# Patient Record
Sex: Female | Born: 2000 | Race: Black or African American | Hispanic: No | Marital: Single | Smoking: Never smoker
Health system: Southern US, Community
[De-identification: ages and names within clinical notes are randomized; demographics above are authoritative.]

## PROBLEM LIST (undated history)

## (undated) DIAGNOSIS — T7840XA Allergy, unspecified, initial encounter: Secondary | ICD-10-CM

## (undated) HISTORY — PX: NO PAST SURGERIES: SHX2092

## (undated) HISTORY — DX: Allergy, unspecified, initial encounter: T78.40XA

---
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
## 2022-01-01 ENCOUNTER — Other Ambulatory Visit: Payer: Self-pay

## 2022-01-01 ENCOUNTER — Encounter (HOSPITAL_BASED_OUTPATIENT_CLINIC_OR_DEPARTMENT_OTHER): Payer: Self-pay

## 2022-01-01 ENCOUNTER — Emergency Department (HOSPITAL_BASED_OUTPATIENT_CLINIC_OR_DEPARTMENT_OTHER)
Admission: EM | Admit: 2022-01-01 | Discharge: 2022-01-01 | Disposition: A | Discharge status: Home Health | Payer: Medicaid Other | Attending: Emergency Medicine | Admitting: Emergency Medicine

## 2022-01-01 DIAGNOSIS — N760 Acute vaginitis: Secondary | ICD-10-CM | POA: Diagnosis not present

## 2022-01-01 DIAGNOSIS — B9689 Other specified bacterial agents as the cause of diseases classified elsewhere: Secondary | ICD-10-CM | POA: Diagnosis not present

## 2022-01-01 DIAGNOSIS — H6123 Impacted cerumen, bilateral: Secondary | ICD-10-CM | POA: Diagnosis not present

## 2022-01-01 DIAGNOSIS — Z202 Contact with and (suspected) exposure to infections with a predominantly sexual mode of transmission: Secondary | ICD-10-CM

## 2022-01-01 LAB — WET PREP, GENITAL
Sperm: NONE SEEN
Trich, Wet Prep: NONE SEEN
WBC, Wet Prep HPF POC: <10 (ref <10)
Yeast Wet Prep HPF POC: NONE SEEN

## 2022-01-01 LAB — PREGNANCY, URINE: Preg Test, Ur: NEGATIVE

## 2022-01-01 MED ORDER — METRONIDAZOLE 500 MG PO TABS: 500.0000 mg | ORAL_TABLET | Freq: Two times a day (BID) | ORAL | 0 refills | Status: DC

## 2022-01-01 MED ORDER — CARBAMIDE PEROXIDE 6.5 % OT SOLN: 5.0000 [drp] | Freq: Two times a day (BID) | OTIC | 0 refills | Status: DC

## 2022-01-01 NOTE — ED Notes (Signed)
Pt states she would like an STD check. Denies any symptoms but states she thinks she was exposed to someone. Pt also states she would like her ear irrigated because she has been having some ringing and trouble hearing.  ?

## 2022-01-01 NOTE — Discharge Instructions (Addendum)
Your wet prep was positive for clue cells.  I will treat you with Flagyl for BV. ?We will contact you results of your lab work when it is available. ?Use the Debrox to help with the earwax. ?Return to the ER for severe abdominal or pelvic pain, abnormal bleeding, lightheadedness or chest pain ?

## 2022-01-01 NOTE — ED Notes (Signed)
Pt requesting to have ears flushed ?

## 2022-01-01 NOTE — ED Triage Notes (Addendum)
States was exposed to chlamydia. Wants to be swabbed for STD and get pregnancy test. Also states right ear has been "clogged". ?

## 2022-01-01 NOTE — ED Provider Notes (Signed)
?[facility] EM[facility]ENCY DEPARTMENT ?Provider Note ? ? ?CSN: [id number] ?Arrival date & time: 01/01/22  1015 ? ?  ? ?History ? ?Chief Complaint  ?Patient presents with  ? Exposure to STD  ? ? ?Colleen Cunningham is a 21 y.o. female presenting to the ED for multiple complaints.  Reports earwax buildup in both of her ears that she intermittently gets removed at PCPs office.  She has not done this in several months.  Reports muffled hearing because of this. ?Also concerned about STDs.  States that she was "indirectly" exposed to chlamydia and would like to be tested and treated.  Denies any vaginal discharge, abnormal bleeding, pelvic pain, sore throat or congestion. ? ? ?Exposure to STD ?Pertinent negatives include no chest pain, no abdominal pain and no shortness of breath.  ? ?  ? ?Home Medications ?Prior to Admission medications   ?Medication Sig Start Date End Date Taking? Authorizing Provider  ?carbamide peroxide (DEBROX) 6.5 % OTIC solution Place 5 drops into both ears 2 (two) times daily. 01/01/22  Yes Chevie Birkhead, PA-C  ?metroNIDAZOLE (FLAGYL) 500 MG tablet Take 1 tablet (500 mg total) by mouth 2 (two) times daily. 01/01/22  Yes Jerzy Roepke, PA-C  ?albuterol (VENTOLIN HFA) 108 (90 Base) MCG/ACT inhaler Inhale 2 puffs into the lungs every 6 (six) hours as needed for wheezing or shortness of breath. 10/29/20   Hall-Potvin, [place], PA-C  ?cetirizine (ZYRTEC) 10 MG tablet TAKE 1 TABLET BY MOUTH NIGHTLY AS NEEDED FOR ALLERGY 10/17/21   Rema Fendt, NP  ?mirtazapine (REMERON) 7.5 MG tablet Take 1 tablet (7.5 mg total) by mouth at bedtime. ?Patient not taking: Reported on 11/11/2021 07/23/21   Georges Mouse, NP  ?SUMAtriptan (IMITREX) 25 MG tablet Take 25 mg (1 tablet total) by mouth at the start of the headache. May repeat in 2 hours x 1 if headache persists. Max of 2 tablets/24 hours. ?Patient not taking: Reported on 10/11/2021 09/24/21   Rema Fendt, NP  ?   ? ?Allergies    ?Patient has no known  allergies.   ? ?Review of Systems   ?Review of Systems  ?Constitutional:  Negative for appetite change, chills and fever.  ?HENT:  Negative for ear pain, rhinorrhea, sneezing and sore throat.   ?Eyes:  Negative for photophobia and visual disturbance.  ?Respiratory:  Negative for cough, chest tightness, shortness of breath and wheezing.   ?Cardiovascular:  Negative for chest pain and palpitations.  ?Gastrointestinal:  Negative for abdominal pain, blood in stool, constipation, diarrhea, nausea and vomiting.  ?Genitourinary:  Negative for dysuria, hematuria and urgency.  ?Musculoskeletal:  Negative for myalgias.  ?Skin:  Negative for rash.  ?Neurological:  Negative for dizziness, weakness and light-headedness.  ? ?Physical Exam ?Updated Vital Signs ?BP (!) 97/49   Pulse 68   Temp 98.2 ?F (36.8 ?C) (Oral)   Resp 18   Ht 5\' 2"  (1.575 m)   Wt 54.4 kg   LMP 12/22/2021 (Exact Date)   SpO2 100%   BMI 21.95 kg/m?  ?Physical Exam ?Vitals and nursing note reviewed.  ?Constitutional:   ?   General: She is not in acute distress. ?   Appearance: She is well-developed.  ?HENT:  ?   Head: Normocephalic and atraumatic.  ?   Ears:  ?   Comments: Some cerumen noted in bilateral ears although not impacted. ?   Nose: Nose normal.  ?Eyes:  ?   General: No scleral icterus.    ?  Left eye: No discharge.  ?   Conjunctiva/sclera: Conjunctivae normal.  ?Cardiovascular:  ?   Rate and Rhythm: Normal rate and regular rhythm.  ?   Heart sounds: Normal heart sounds. No murmur heard. ?  No friction rub. No gallop.  ?Pulmonary:  ?   Effort: Pulmonary effort is normal. No respiratory distress.  ?   Breath sounds: Normal breath sounds.  ?Abdominal:  ?   General: Bowel sounds are normal. There is no distension.  ?   Palpations: Abdomen is soft.  ?   Tenderness: There is no abdominal tenderness. There is no guarding.  ?Musculoskeletal:     ?   General: Normal range of motion.  ?   Cervical back: Normal range of motion and neck supple.  ?Skin: ?    General: Skin is warm and dry.  ?   Findings: No rash.  ?Neurological:  ?   Mental Status: She is alert.  ?   Motor: No abnormal muscle tone.  ?   Coordination: Coordination normal.  ? ? ?ED Results / Procedures / Treatments   ?Labs ?(all labs ordered are listed, but only abnormal results are displayed) ?Labs Reviewed  ?WET PREP, GENITAL - Abnormal; Notable for the following components:  ?    Result Value  ? Clue Cells Wet Prep HPF POC PRESENT (*)   ? All other components within normal limits  ?PREGNANCY, URINE  ?GC/CHLAMYDIA PROBE AMP ([place]) NOT AT [facility]  ? ? ?EKG ?None ? ?Radiology ?No results found. ? ?Procedures ?Procedures  ? ? ?Medications Ordered in ED ?Medications - No data to display ? ?ED Course/ Medical Decision Making/ A&P ?Clinical Course as of 01/01/22 1302  ?Wed Jan 01, 2022  ?1235 Preg Test, Ur: NEGATIVE [HK]  ?1300 Clue Cells Wet Prep HPF POC(!): PRESENT [HK]  ?  ?Clinical Course User Index ?[HK] Idelle Leech, Melisha Eggleton, PA-C  ? ?                        ?Medical Decision Making ?Amount and/or Complexity of Data Reviewed ?Labs: ordered. Decision-making details documented in ED Course. ? ?Risk ?OTC drugs. ?Prescription drug management. ? ? ?21 year old female presenting to the ED for STD testing and ear irrigation.  There is no impacted cerumen seen on exam so we will treat with Debrox outpatient.  In regards to STD testing she self swab she is asymptomatic at this time.  She elects to wait for results of STD testing prior to treatment.  She is declining HIV and RPR testing.  Wet prep positive for clue cells so we will treat with Flagyl.  Return precautions given ? ? ? ?Patient is hemodynamically stable, in NAD, and able to ambulate in the ED. Evaluation does not show pathology that would require ongoing emergent intervention or inpatient treatment. I explained the diagnosis to the patient. Pain has been managed and has no complaints prior to discharge. Patient is comfortable with above plan and is stable  for discharge at this time. All questions were answered prior to disposition. Strict return precautions for returning to the ED were discussed. Encouraged follow up with PCP.  ? ?An After Visit Summary was printed and given to the patient. ? ? ?Portions of this note were generated with Scientist, clinical (histocompatibility and immunogenetics). Dictation errors may occur despite best attempts at proofreading. ? ? ? ? ? ? ? ? ?Final Clinical Impression(s) / ED Diagnoses ?Final diagnoses:  ?Possible exposure to STD  ?Excessive cerumen in  both ear canals  ?Bacterial vaginosis  ? ? ?Rx / DC Orders ?ED Discharge Orders   ? ?      Ordered  ?  carbamide peroxide (DEBROX) 6.5 % OTIC solution  2 times daily       ? 01/01/22 1111  ?  metroNIDAZOLE (FLAGYL) 500 MG tablet  2 times daily       ? 01/01/22 1300  ? ?  ?  ? ?  ? ? ?  Dietrich Pates?Griff Badley, PA-C ?01/01/22 1303 ? ?  ?Vanetta MuldersZackowski, Scott, MD ?01/06/22 [phone] ? ?

## 2022-01-01 NOTE — ED Notes (Signed)
Pt self swab for wet prep ?

## 2022-01-01 NOTE — ED Notes (Signed)
Pt will self swab for wet prep and gc/chlamydia per EDP Hina ?

## 2022-01-02 ENCOUNTER — Telehealth (HOSPITAL_COMMUNITY): Payer: Self-pay

## 2022-01-02 LAB — GC/CHLAMYDIA PROBE AMP (~~LOC~~) NOT AT ARMC
Chlamydia: POSITIVE — AB
Comment: NEGATIVE
Comment: NORMAL
Neisseria Gonorrhea: NEGATIVE

## 2022-01-02 MED ORDER — AZITHROMYCIN 500 MG PO TABS: 1000.0000 mg | ORAL_TABLET | Freq: Once | ORAL | 0 refills | Status: AC

## 2022-01-22 ENCOUNTER — Other Ambulatory Visit: Payer: Self-pay | Admitting: Family

## 2022-01-22 DIAGNOSIS — H6123 Impacted cerumen, bilateral: Secondary | ICD-10-CM

## 2022-01-27 ENCOUNTER — Encounter: Payer: Self-pay | Admitting: Physician Assistant

## 2022-01-27 ENCOUNTER — Other Ambulatory Visit (HOSPITAL_COMMUNITY)
Admission: RE | Admit: 2022-01-27 | Discharge: 2022-01-27 | Disposition: A | Discharge status: Home / Self Care | Payer: Medicaid Other | Source: Ambulatory Visit | Attending: Physician Assistant | Admitting: Physician Assistant

## 2022-01-27 ENCOUNTER — Ambulatory Visit (INDEPENDENT_AMBULATORY_CARE_PROVIDER_SITE_OTHER): Payer: Medicaid Other | Admitting: Physician Assistant

## 2022-01-27 VITALS — BP 123/74 | HR 84 | Temp 98.5°F | Resp 18 | Ht 62.0 in | Wt 116.0 lb

## 2022-01-27 DIAGNOSIS — Z3202 Encounter for pregnancy test, result negative: Secondary | ICD-10-CM

## 2022-01-27 DIAGNOSIS — Z8619 Personal history of other infectious and parasitic diseases: Secondary | ICD-10-CM | POA: Insufficient documentation

## 2022-01-27 DIAGNOSIS — B3731 Acute candidiasis of vulva and vagina: Secondary | ICD-10-CM

## 2022-01-27 DIAGNOSIS — Z7689 Persons encountering health services in other specified circumstances: Secondary | ICD-10-CM | POA: Diagnosis not present

## 2022-01-27 LAB — POCT URINE PREGNANCY: Preg Test, Ur: NEGATIVE

## 2022-01-27 NOTE — Progress Notes (Signed)
Patient has eaten today. ?Patient has not taken medication today. ?Patient denies pain at this time. ?Patient request testing due to exposure to STI ?

## 2022-01-27 NOTE — Progress Notes (Signed)
? ?Established Patient Office Visit ? ?Subjective:  ?Patient ID: Colleen Cunningham, female    DOB: 2001/05/10  Age: 21 y.o. MRN: [id number] ? ?CC:  ?Chief Complaint  ?Patient presents with  ? Exposure to STD  ? ? ?HPI ?Colleen Cunningham states that she was treated for chlamydia last month, states that she did complete her treatment.  States she would like to get a test of cure.  States she did not have any type of symptoms of chlamydia when she tested positive and is still not having any type of symptoms. ? ?Also requests pregnancy test.  Does endorse recent menses. ? ?Past Medical History:  ?Diagnosis Date  ? Allergy   ? Asthma   ? ? ?Past Surgical History:  ?Procedure Laterality Date  ? NO PAST SURGERIES    ? ? ?Family History  ?Problem Relation Age of Onset  ? Asthma Mother   ? Hypertension Father 101  ? Hypertension Paternal Aunt   ? Hypertension Paternal Uncle   ? Hypertension Paternal Grandmother   ? Cancer Paternal Grandmother   ? ? ?Social History  ? ?Socioeconomic History  ? Marital status: Single  ?  Spouse name: Not on file  ? Number of children: Not on file  ? Years of education: Not on file  ? Highest education level: Not on file  ?Occupational History  ? Not on file  ?Tobacco Use  ? Smoking status: Never  ? Smokeless tobacco: Never  ?Vaping Use  ? Vaping Use: Never used  ?Substance and Sexual Activity  ? Alcohol use: No  ? Drug use: Yes  ?  Types: Marijuana  ? Sexual activity: Yes  ?  Birth control/protection: None  ?Other Topics Concern  ? Not on file  ?Social History Narrative  ? [employer]  ? 10th grade  ? Lives home with dad.  ? ?Social Determinants of Health  ? ?Financial Resource Strain: Not on file  ?Food Insecurity: Not on file  ?Transportation Needs: Not on file  ?Physical Activity: Not on file  ?Stress: Not on file  ?Social Connections: Not on file  ?Intimate Partner Violence: Not on file  ? ? ?Outpatient Medications Prior to Visit  ?Medication Sig Dispense Refill  ? albuterol (VENTOLIN HFA) 108  (90 Base) MCG/ACT inhaler Inhale 2 puffs into the lungs every 6 (six) hours as needed for wheezing or shortness of breath. 8 g 2  ? carbamide peroxide (DEBROX) 6.5 % OTIC solution Place 5 drops into both ears 2 (two) times daily. 15 mL 0  ? cetirizine (ZYRTEC) 10 MG tablet TAKE 1 TABLET BY MOUTH NIGHTLY AS NEEDED FOR ALLERGY 90 tablet 1  ? metroNIDAZOLE (FLAGYL) 500 MG tablet Take 1 tablet (500 mg total) by mouth 2 (two) times daily. 14 tablet 0  ? mirtazapine (REMERON) 7.5 MG tablet Take 1 tablet (7.5 mg total) by mouth at bedtime. (Patient not taking: Reported on 11/11/2021) 90 tablet 0  ? SUMAtriptan (IMITREX) 25 MG tablet Take 25 mg (1 tablet total) by mouth at the start of the headache. May repeat in 2 hours x 1 if headache persists. Max of 2 tablets/24 hours. (Patient not taking: Reported on 10/11/2021) 30 tablet 0  ? ?No facility-administered medications prior to visit.  ? ? ?No Known Allergies ? ?ROS ?Review of Systems  ?Constitutional: Negative.   ?HENT: Negative.    ?Eyes: Negative.   ?Respiratory:  Negative for shortness of breath.   ?Cardiovascular:  Negative for chest pain.  ?Gastrointestinal:  Negative for  abdominal pain, nausea and vomiting.  ?Endocrine: Negative.   ?Genitourinary:  Negative for dysuria, genital sores and vaginal discharge.  ?Musculoskeletal: Negative.   ?Skin: Negative.   ?Allergic/Immunologic: Negative.   ?Neurological: Negative.   ?Hematological: Negative.   ?Psychiatric/Behavioral: Negative.    ? ?  ?Objective:  ?  ?Physical Exam ?Vitals and nursing note reviewed.  ?Constitutional:   ?   Appearance: Normal appearance.  ?HENT:  ?   Head: Normocephalic and atraumatic.  ?   Right Ear: External ear normal.  ?   Left Ear: External ear normal.  ?   Nose: Nose normal.  ?   Mouth/Throat:  ?   Mouth: Mucous membranes are moist.  ?   Pharynx: Oropharynx is clear.  ?Eyes:  ?   Extraocular Movements: Extraocular movements intact.  ?   Conjunctiva/sclera: Conjunctivae normal.  ?   Pupils:  Pupils are equal, round, and reactive to light.  ?Cardiovascular:  ?   Rate and Rhythm: Normal rate and regular rhythm.  ?   Pulses: Normal pulses.  ?   Heart sounds: Normal heart sounds.  ?Pulmonary:  ?   Effort: Pulmonary effort is normal.  ?   Breath sounds: Normal breath sounds.  ?Musculoskeletal:     ?   General: Normal range of motion.  ?   Cervical back: Normal range of motion and neck supple.  ?Skin: ?   General: Skin is warm and dry.  ?Neurological:  ?   General: No focal deficit present.  ?   Mental Status: She is alert and oriented to person, place, and time.  ?Psychiatric:     ?   Mood and Affect: Mood normal.     ?   Behavior: Behavior normal.     ?   Thought Content: Thought content normal.     ?   Judgment: Judgment normal.  ? ? ?BP 123/74 (BP Location: Left Arm, Patient Position: Sitting, Cuff Size: Normal)   Pulse 84   Temp 98.5 ?F (36.9 ?C) (Oral)   Resp 18   Ht _0  (1.575 m)   Wt 116 lb (52.6 kg)   LMP 01/21/2022   SpO2 100%   BMI 21.22 kg/m?  ?Wt Readings from Last 3 Encounters:  ?01/27/22 116 lb (52.6 kg) (30 %, Z= -0.52)*  ?01/01/22 120 lb (54.4 kg) (39 %, Z= -0.27)*  ?11/11/21 122 lb (55.3 kg) (44 %, Z= -0.15)*  ? ?* Growth percentiles are based on CDC (Girls, 2-20 Years) data.  ? ? ? ?Health Maintenance Due  ?Topic Date Due  ? HPV VACCINES (1 - 2-dose series) Never done  ? Hepatitis C Screening  Never done  ? ? ?   ?Topic Date Due  ? HPV VACCINES (1 - 2-dose series) Never done  ? ? ?No results found for: TSH ?No results found for: WBC, HGB, HCT, MCV, PLT ?No results found for: NA, K, CHLORIDE, CO2, GLUCOSE, BUN, CREATININE, BILITOT, ALKPHOS, AST, ALT, PROT, ALBUMIN, CALCIUM, ANIONGAP, EGFR, GFR ?No results found for: CHOL ?No results found for: HDL ?No results found for: [place] ?No results found for: TRIG ?No results found for: CHOLHDL ?No results found for: HGBA1C ? ?  ?Assessment & Plan:  ? ?Problem List Items Addressed This Visit   ? ?  ? Other  ? History of chlamydia infection  - Primary  ? ?Other Visit Diagnoses   ? ? Encounter for pregnancy test with result negative      ? Encounter for assessment of  STD exposure      ? Relevant Orders  ? Cervicovaginal ancillary only  ? POCT urine pregnancy (Completed)  ? ?  ? ? ?No orders of the defined types were placed in this encounter. ?1. History of chlamydia infection ?Patient education given on current guidelines for test of cure 3 months after treatment.  Patient does want to have testing completed today.  Declines testing for syphilis or HIV. ? ?2. Encounter for pregnancy test with result negative ? ? ?3. Encounter for assessment of STD exposure ? ?- Cervicovaginal ancillary only ?- POCT urine pregnancy ? ? ?I have reviewed the patient's medical history (PMH, PSH, Social History, Family History, Medications, and allergies) , and have been updated if relevant. I spent 20 minutes reviewing chart and  face to face time with patient. ? ? ? ? ?Follow-up: Return if symptoms worsen or fail to improve.  ? ? ?Deziyah Arvin S Mayers, PA-C ?

## 2022-01-27 NOTE — Patient Instructions (Signed)
Chlamydia, Female  Chlamydia is a sexually transmitted infection (STI). This infection spreads through sexual contact. Chlamydia can occur in different areas of the body, including: The urethra. This is the part of the body that drains urine from the bladder. The cervix. This is the lowest part of the uterus. The throat. The rectum. This condition is not difficult to treat. However, if left untreated, chlamydia can lead to more serious health problems, including pelvic inflammatory disease (PID). PID can increase your risk of being unable to have children. In pregnant women, untreated chlamydia can cause serious complications during pregnancy or health problems for the baby after delivery. What are the causes? This condition is caused by a bacteria called Chlamydia trachomatis. The bacteria are spread from an infected partner during sexual activity. Chlamydia can spread through contact with the genitals, mouth, or rectum. What increases the risk? The following factors may make you more likely to develop this condition: Not using a condom the right way or not using a condom every time you have sex. Having a new sex partner or having more than one sex partner. Being sexually active before age 25. What are the signs or symptoms? In some cases, there are no symptoms, especially early in the infection. If symptoms develop, they may include: Urinating often, or a burning feeling during urination. Redness, soreness, or swelling of the vagina or rectum. Discharge coming from the vagina or rectum. Pain in the abdomen. Pain during sex. Bleeding between menstrual periods or irregular periods. How is this diagnosed? This condition may be diagnosed with: Urine tests. Swab tests. Depending on your symptoms, your health care provider may use a cotton swab to collect a fluid sample from your vagina, rectum, nose, or throat to test for the bacteria. A pelvic exam. How is this treated? This condition is  treated with antibiotic medicines. Follow these instructions at home: Sexual activity Tell your sex partner or partners about your infection. These include any partners for oral, anal, or vaginal sex that you have had within 60 days of when your symptoms started. Sex partners should also be treated, even if they have no signs of the infection. Do not have sex until you and your sex partners have completed treatment and your health care provider says it is okay. If your health care provider prescribed you a single-dose medicine as treatment, wait at least 7 days after taking the medicine before having sex. General instructions Take over-the-counter and prescription medicines as told by your health care provider. Finish all antibiotic medicine even when you start to feel better. It is up to you to get your test results. Ask your health care provider, or the department that is doing the test, when your results will be ready. Keep all follow-up visits. This is important. You may need to be tested for infection again 3 months after treatment. How is this prevented? You can lower your risk of getting chlamydia by: Using latex or polyurethane condoms correctly every time you have sex. Not having multiple sex partners. Asking if your sex partner has been tested for STIs and had negative results. Getting regular health screenings to check for STIs. Contact a health care provider if: You develop new symptoms or your symptoms are getting worse. Your symptoms do not get better after treatment. You have a fever or chills. You have pain during sex. You have irregular menstrual periods, or you have bleeding between periods or after sex. You develop flu-like symptoms, such as night sweats, sore throat,   or muscle aches. You are unable to take your antibiotic medicine as prescribed. Summary Chlamydia is a sexually transmitted infection (STI) that is caused by bacteria. This infection spreads through sexual  contact. This condition is treated with antibiotic medicines. If left untreated, chlamydia can lead to more serious health problems, including pelvic inflammatory disease (PID). Your sex partners will also need to be treated. Do not have sex until both you and your partner have been treated. Take medicines as directed by your health care provider and keep all follow-up visits to ensure your infection has been completely treated. This information is not intended to replace advice given to you by your health care provider. Make sure you discuss any questions you have with your health care provider. Document Revised: 07/22/2021 Document Reviewed: 07/22/2021 Elsevier Patient Education  2023 Elsevier Inc.  

## 2022-01-28 LAB — CERVICOVAGINAL ANCILLARY ONLY
Bacterial Vaginitis (gardnerella): NEGATIVE
Candida Glabrata: NEGATIVE
Candida Vaginitis: POSITIVE — AB
Chlamydia: POSITIVE — AB
Comment: NEGATIVE
Comment: NEGATIVE
Comment: NEGATIVE
Comment: NEGATIVE
Comment: NEGATIVE
Comment: NORMAL
Neisseria Gonorrhea: NEGATIVE
Trichomonas: NEGATIVE

## 2022-01-28 MED ORDER — FLUCONAZOLE 150 MG PO TABS: 150.0000 mg | ORAL_TABLET | Freq: Once | ORAL | 0 refills | Status: AC

## 2022-01-28 NOTE — Addendum Note (Signed)
Addended by: Roney Jaffe on: 01/28/2022 12:37 PM ? ? Modules accepted: Orders ? ?

## 2022-01-31 ENCOUNTER — Telehealth: Payer: Self-pay | Admitting: *Deleted

## 2022-01-31 NOTE — Telephone Encounter (Signed)
Patient verified DOB ?Patient viewed results via mychart and will use condoms until she retest in 2 months. Patient will pick up diflucan for yeast and take today. ?

## 2022-01-31 NOTE — Telephone Encounter (Signed)
-----   Message from Roney Jaffe, [place] sent at 01/28/2022 12:37 PM EDT ----- ?Please help patient as expected her vaginal swab was positive for chlamydia, she just completed treatment of this and will do a test of cure in 2-1/2 months.  She did test positive for a yeast infection, Diflucan sent to her pharmacy. ?

## 2022-02-04 ENCOUNTER — Ambulatory Visit: Payer: Medicaid Other | Admitting: Family

## 2022-03-05 ENCOUNTER — Other Ambulatory Visit: Payer: Self-pay | Admitting: Pediatrics

## 2022-04-09 IMAGING — US US ABDOMEN COMPLETE
1 series · 15 of 25 positions shown · non-contrast
Comparison: None.

CLINICAL DATA: Abdominal pain.

EXAM:
ABDOMEN ULTRASOUND COMPLETE

[Series 1: us abdomen complete mc & wl · 15 of 87 slices shown]
[im 1/87]
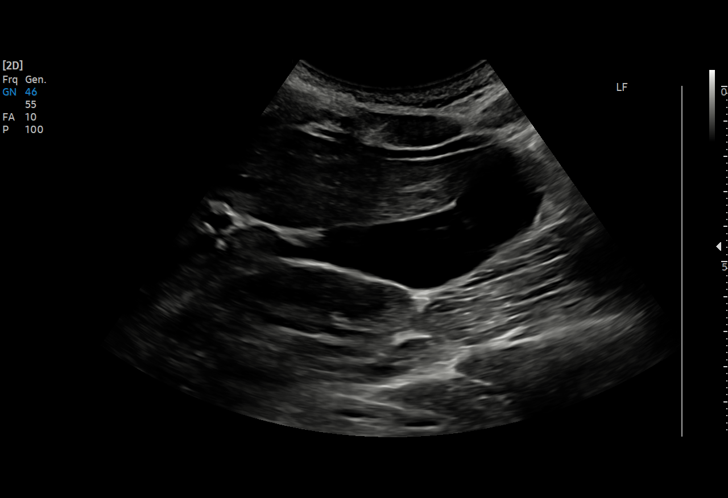
[im 8/87]
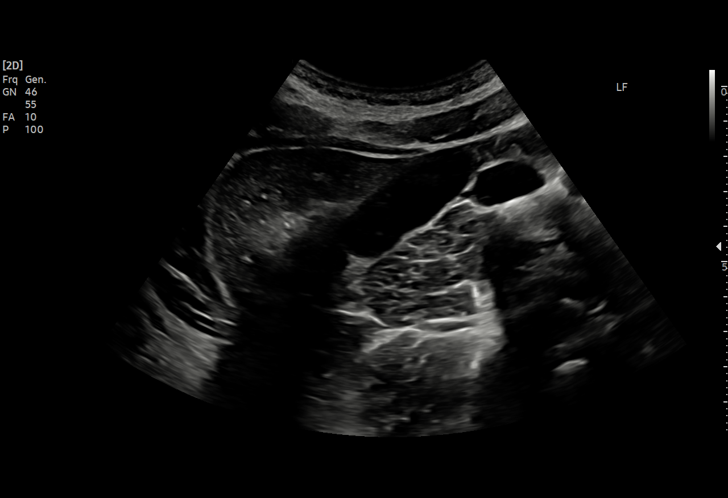
[im 15/87]
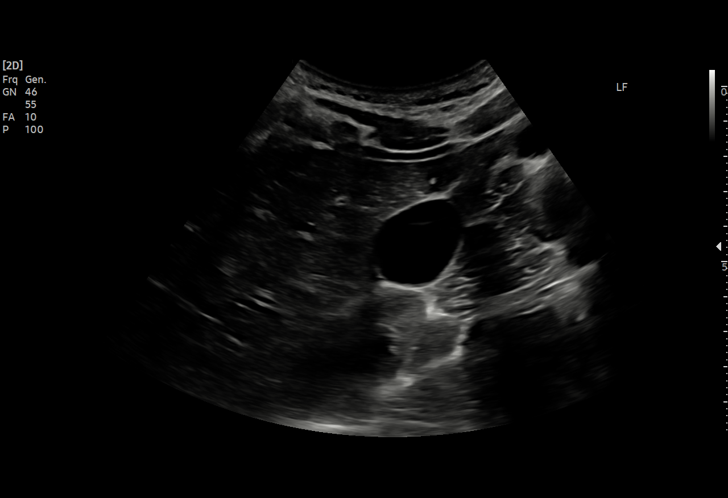
[im 18/87]
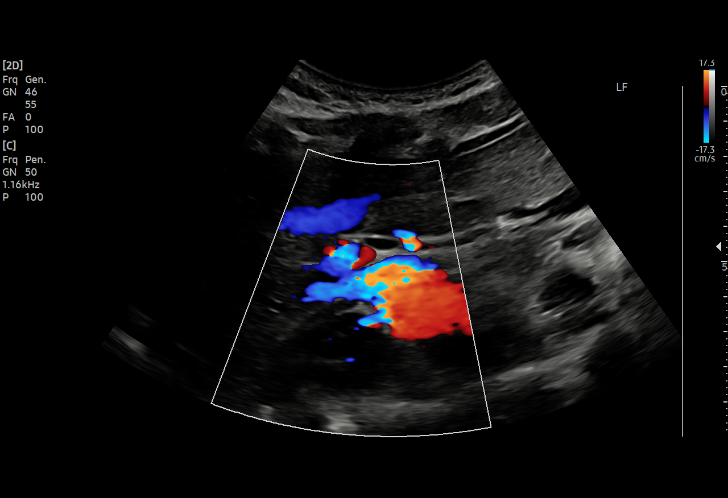
[im 26/87]
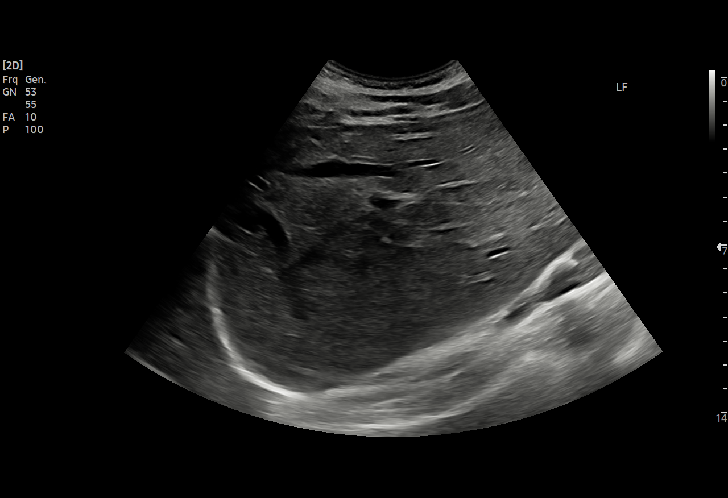
[im 33/87]
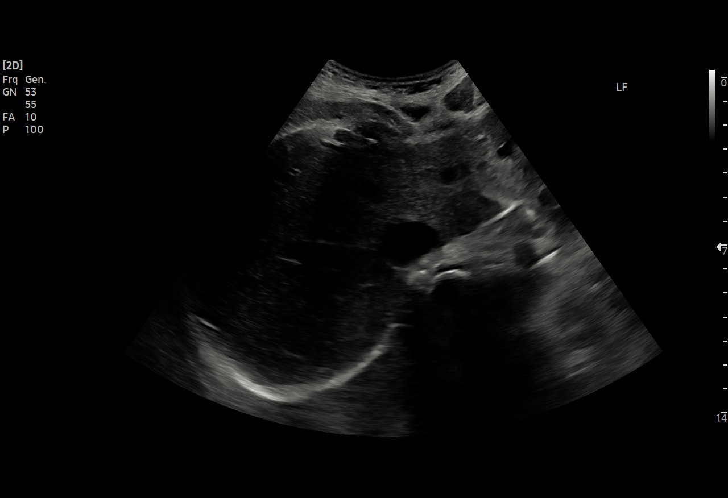
[im 36/87]
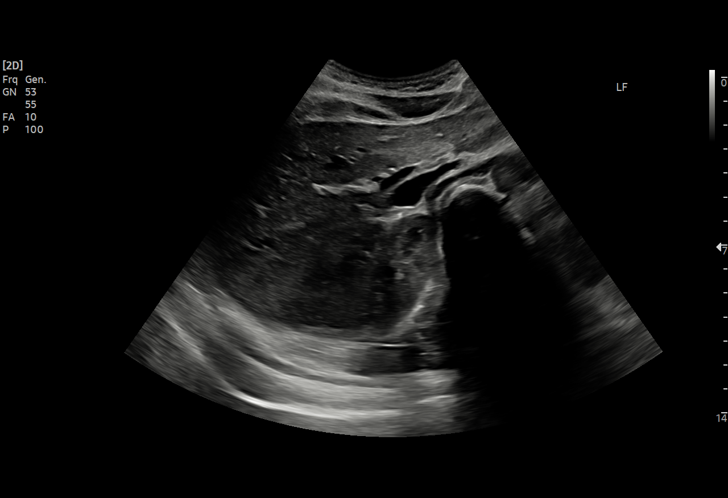
[im 44/87]
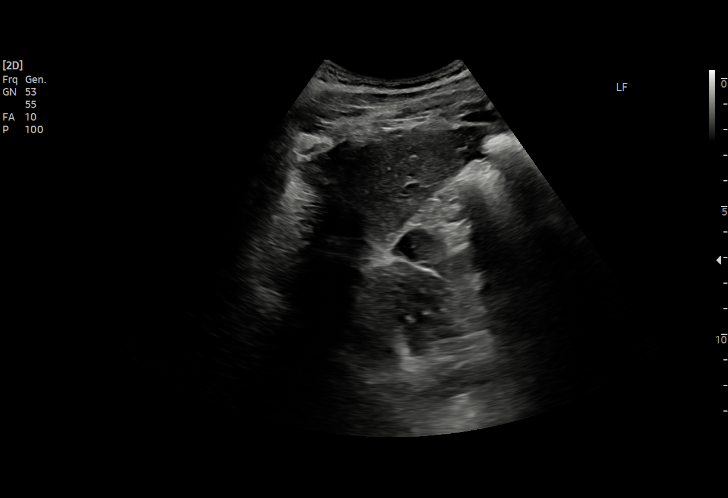
[im 51/87]
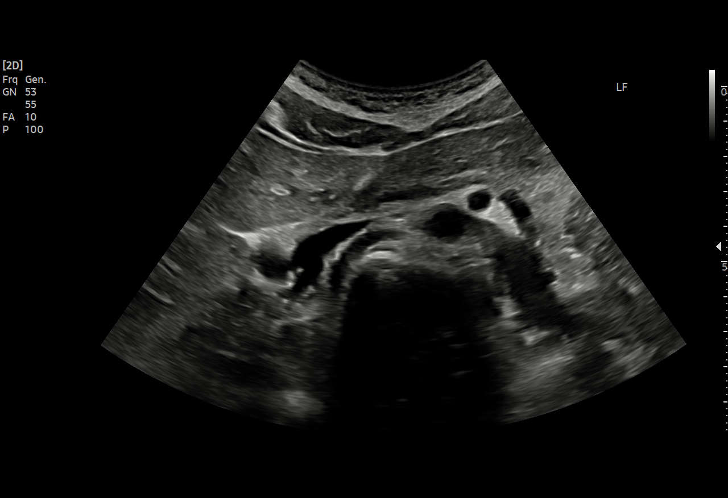
[im 54/87]
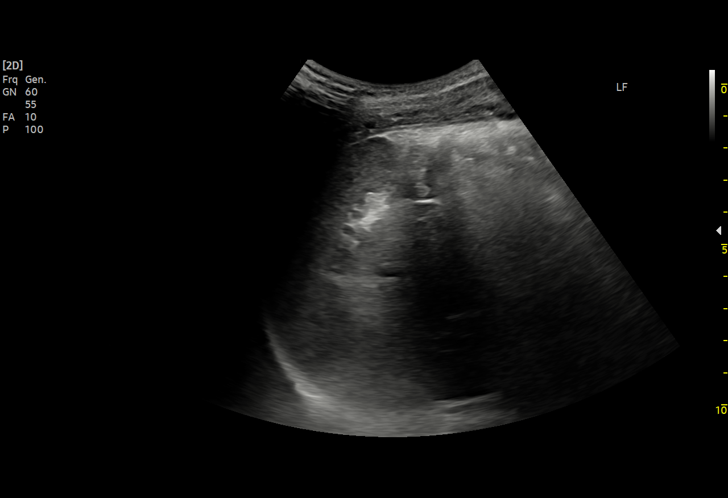
[im 61/87]
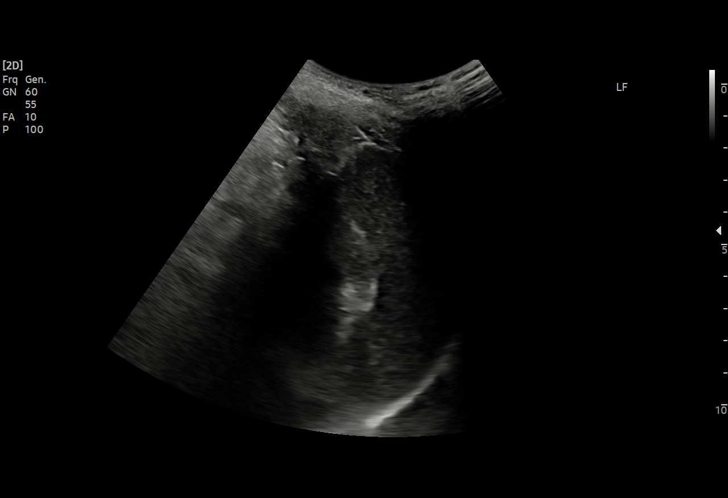
[im 69/87]
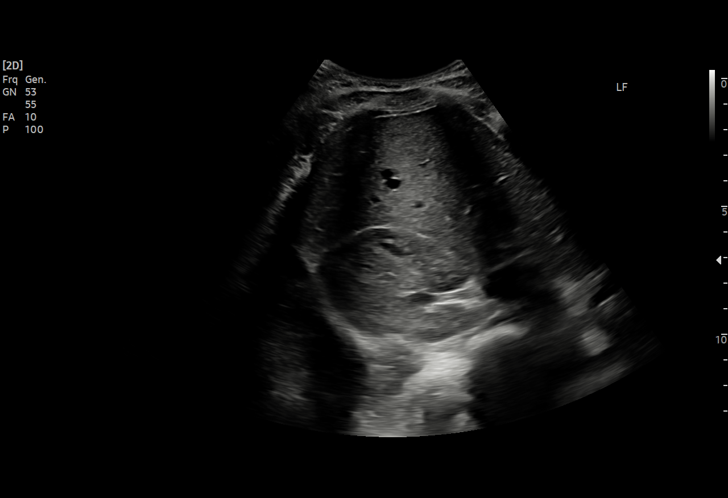
[im 72/87]
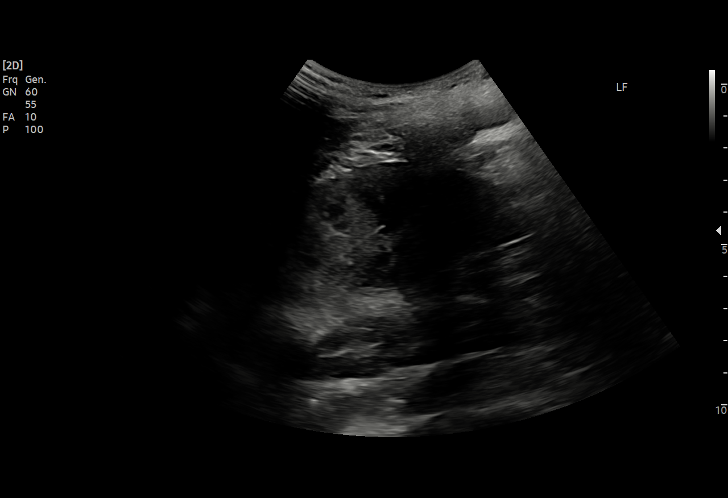
[im 79/87]
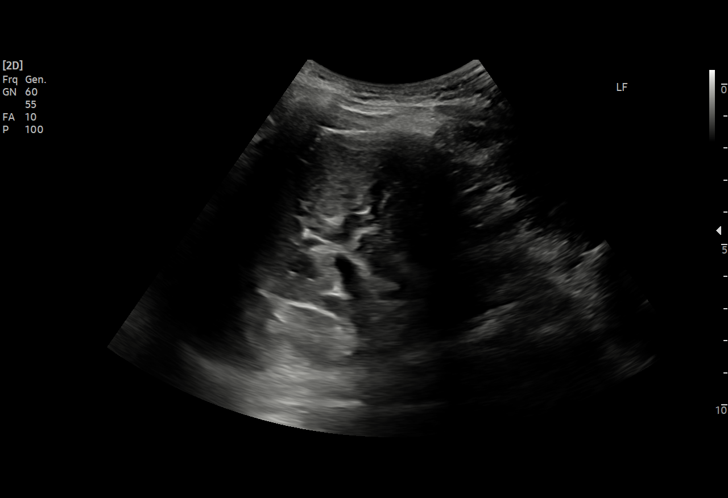
[im 87/87]
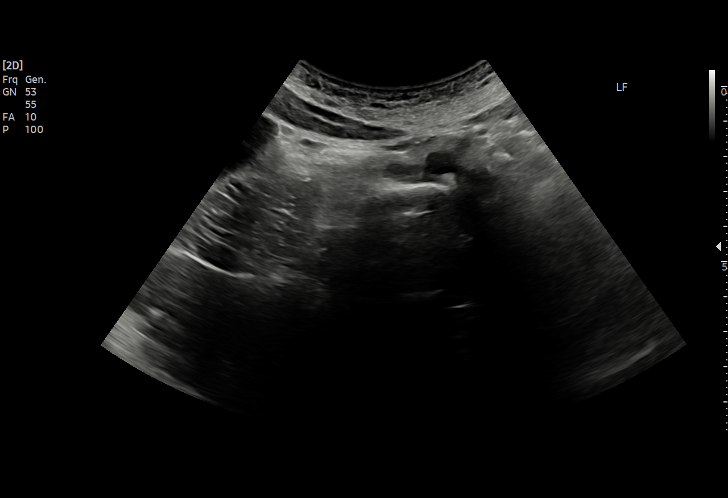

[15 of 25 positions shown; findings below may reference images not displayed]

FINDINGS: Gallbladder: No gallstones or wall thickening visualized (0.7 mm).
No sonographic Murphy sign noted by sonographer.

Common bile duct: Diameter: 4.2 mm

Liver: No focal lesion identified. Within normal limits in
parenchymal echogenicity. Portal vein is patent on color Doppler
imaging with normal direction of blood flow towards the liver.

IVC: No abnormality visualized.

Pancreas: Visualized portion unremarkable.

Spleen: Size (4.3 cm) and appearance within normal limits.

Right Kidney: Length: 10.5 cm. Echogenicity within normal limits. No
mass or hydronephrosis visualized.

Left Kidney: Length: 10.7 cm. Echogenicity within normal limits. No
mass or hydronephrosis visualized.

Abdominal aorta: No aneurysm visualized (1.4 cm in AP diameter).

Other findings: None.
IMPRESSION: Normal abdominal ultrasound.

## 2022-04-24 ENCOUNTER — Encounter: Payer: Self-pay | Admitting: Physician Assistant

## 2022-04-24 ENCOUNTER — Other Ambulatory Visit (HOSPITAL_COMMUNITY)
Admission: RE | Admit: 2022-04-24 | Discharge: 2022-04-24 | Disposition: A | Discharge status: Home / Self Care | Payer: Medicaid Other | Source: Ambulatory Visit | Attending: Physician Assistant | Admitting: Physician Assistant

## 2022-04-24 ENCOUNTER — Ambulatory Visit: Payer: Medicaid Other | Admitting: Physician Assistant

## 2022-04-24 VITALS — BP 119/82 | HR 79 | Resp 18 | Ht 62.0 in | Wt 116.0 lb

## 2022-04-24 DIAGNOSIS — J302 Other seasonal allergic rhinitis: Secondary | ICD-10-CM | POA: Diagnosis not present

## 2022-04-24 DIAGNOSIS — T148XXA Other injury of unspecified body region, initial encounter: Secondary | ICD-10-CM | POA: Diagnosis not present

## 2022-04-24 DIAGNOSIS — B9689 Other specified bacterial agents as the cause of diseases classified elsewhere: Secondary | ICD-10-CM

## 2022-04-24 DIAGNOSIS — F3289 Other specified depressive episodes: Secondary | ICD-10-CM

## 2022-04-24 DIAGNOSIS — Z8619 Personal history of other infectious and parasitic diseases: Secondary | ICD-10-CM | POA: Diagnosis not present

## 2022-04-24 DIAGNOSIS — F32A Depression, unspecified: Secondary | ICD-10-CM | POA: Insufficient documentation

## 2022-04-24 DIAGNOSIS — N760 Acute vaginitis: Secondary | ICD-10-CM

## 2022-04-24 MED ORDER — CETIRIZINE HCL 10 MG PO TABS: ORAL_TABLET | ORAL | 1 refills | Status: DC

## 2022-04-24 NOTE — Patient Instructions (Signed)
I encourage you to use the Neosporin on the affected wounds.  I strongly encourage you to reach out to the [facility] to see if you can schedule an appointment with the counselor, make sure that you keep your follow-up appointment with Amy on Tuesday.  Please let us know if there is anything else we can do for you, we will call you with your lab result when it is available.  Roney Jaffe, PA-C Physician Assistant [facility] [email or web address]   Abrasion An abrasion is a cut or a scrape on the surface of the skin. An abrasion does not go through all the layers of the skin. It is important to care for an abrasion properly to prevent infection. What are the causes? This condition is caused by rubbing your skin on something or falling on a surface, such as the ground. When your skin rubs on something, some layers of skin may rub off. What are the signs or symptoms? The main symptom of this condition is a cut or a scrape. The cut or scrape may be bleeding, or it may appear red or pink. If your abrasion was caused by a fall, there may be a bruise under your cut or scrape. How is this diagnosed? An abrasion is diagnosed with a physical exam. How is this treated? Treatment for this condition depends on how large and deep the abrasion is. In most cases: Your abrasion will be cleaned with water and mild soap. This is done to remove any dirt or debris (such as tiny bits of glass or rock) that may be stuck in your wound. An antibiotic ointment may be applied to your abrasion to help prevent infection. A bandage (dressing) may be placed on your abrasion to keep it clean. You may also need a tetanus shot. Follow these instructions at home: Medicines Take or apply over-the-counter and prescription medicines only as told by your health care provider. If you were prescribed an antibiotic medicine, use it as told by your health care provider. Do  not stop using the antibiotic even if you start to feel better. Wound care Clean your wound 1 or 2 times a day, or as told by your health care provider. To do this: Wash your hands for at least 20 seconds with mild soap and water. Do this before and after you clean your wound. Wash your wound with mild soap and water and then rinse off the soap. Pat your wound dry with a clean towel. Do not rub your wound. Keep your dressing clean and dry as told by your health care provider. There are many different ways to close and cover a wound. Follow instructions from your health care provider about caring for your wound and about changing and removing your dressing. You may have to change your dressing one or more times a day, or as directed by your health care provider. Check your wound every day for signs of infection. Check for: Redness, especially a red streak that spreads out from your wound. Swelling or increased pain. Warmth. Blood, fluid, pus, or a bad smell. Managing pain and swelling  If directed, put ice on the injured area. To do this: Put ice in a plastic bag. Place a towel between your skin and the bag. Leave the ice on for 20 minutes, 2-3 times a day. Remove the ice if your skin turns bright red. This is very important. If you cannot feel pain, heat, or cold, you have a greater risk of  damage to the area. If possible, raise (elevate) the injured area above the level of your heart while you are sitting or lying down. General instructions Do not take baths, swim, or use a hot tub until your health care provider approves. Ask your doctor about taking showers or sponge baths. Keep all follow-up visits. This is important. Contact a health care provider if: You received a tetanus shot, and you have swelling, severe pain, redness, or bleeding at your injection site. Your pain is not controlled with medicine. You have a fever. You have any of these signs of infection: Redness, swelling, or  more pain around your wound. Warmth coming from your wound. Blood, fluid, pus, or a bad smell coming from your wound. Get help right away if: You have a red streak spreading away from your wound. Summary An abrasion is a cut or a scrape on the surface of the skin. Care for your abrasion properly to prevent infection. Clean your wound with mild soap and water 1 or 2 times a day or as often as told. Follow instructions from your health care provider about taking medicines and changing your bandage (dressing). Contact your health care provider if you have a fever or if you have redness, swelling, or more pain around your wound. Contact your health care provider if you have warmth, blood, fluid, pus, or a bad smell coming from your wound. Get help right away if there is a red streak spreading away from your wound. This information is not intended to replace advice given to you by your health care provider. Make sure you discuss any questions you have with your health care provider. Document Revised: 12/29/2019 Document Reviewed: 12/29/2019 Elsevier Patient Education  [place] Depression, Adult Depression is a mental health condition that affects your thoughts, feelings, and actions. Being diagnosed with depression can bring you relief if you did not know why you have felt or behaved a certain way. It could also leave you feeling overwhelmed with uncertainty about your future. Preparing yourself to manage your symptoms can help you feel more positive about your future. How to manage lifestyle changes Managing stress  Stress is your body's reaction to life changes and events, both good and bad. Stress can add to your feelings of depression. Learning to manage your stress can help lessen your feelings of depression. Try some of the following approaches to reducing your stress (stress reduction techniques): Listen to music that you enjoy and that inspires you. Try using a  meditation app or take a meditation class. Develop a practice that helps you connect with your spiritual self. Walk in nature, pray, or go to a place of worship. Do some deep breathing. To do this, inhale slowly through your nose. Pause at the top of your inhale for a few seconds and then exhale slowly, letting your muscles relax. Practice yoga to help relax and work your muscles. Choose a stress reduction technique that suits your lifestyle and personality. These techniques take time and practice to develop. Set aside 5-15 minutes a day to do them. Therapists can offer training in these techniques. Other things you can do to manage stress include: Keeping a stress diary. Knowing your limits and saying no when you think something is too much. Paying attention to how you react to certain situations. You may not be able to control everything, but you can change your reaction. Adding humor to your life by watching funny films or TV shows. Making time for  activities that you enjoy and that relax you.  Medicines Medicines, such as antidepressants, are often a part of treatment for depression. Talk with your pharmacist or health care provider about all the medicines, supplements, and herbal products that you take, their possible side effects, and what medicines and other products are safe to take together. Make sure to report any side effects you may have to your health care provider. Relationships Your health care provider may suggest family therapy, couples therapy, or individual therapy as part of your treatment. How to recognize changes Everyone responds differently to treatment for depression. As you recover from depression, you may start to: Have more interest in doing activities. Feel less hopeless. Have more energy. Overeat less often, or have a better appetite. Have better mental focus. It is important to recognize if your depression is not getting better or is getting worse. The symptoms  you had in the beginning may return, such as: Tiredness (fatigue) or low energy. Eating too much or too little. Sleeping too much or too little. Feeling restless, agitated, or hopeless. Trouble focusing or making decisions. Unexplained physical complaints. Feeling irritable, angry, or aggressive. If you or your family members notice these symptoms coming back, let your health care provider know right away. Follow these instructions at home: Activity  Try to get some form of exercise each day, such as walking, biking, swimming, or lifting weights. Practice stress reduction techniques. Engage your mind by taking a class or doing some volunteer work. Lifestyle Get the right amount and quality of sleep. Cut down on using caffeine, tobacco, alcohol, and other potentially harmful substances. Eat a healthy diet that includes plenty of vegetables, fruits, whole grains, low-fat dairy products, and lean protein. Do not eat a lot of foods that are high in solid fats, added sugars, or salt (sodium). General instructions Take over-the-counter and prescription medicines only as told by your health care provider. Keep all follow-up visits as told by your health care provider. This is important. Where to find support Talking to others  Friends and family members can be sources of support and guidance. Talk to trusted friends or family members about your condition. Explain your symptoms to them, and let them know that you are working with a health care provider to treat your depression. Tell friends and family members how they also can be helpful. Finances Find appropriate mental health providers that fit with your financial situation. Talk with your health care provider about options to get reduced prices on your medicines. Where to find more information You can find support in your area from: Anxiety and Depression Association of America (ADAA): www.adaa.org Mental Health America:  www.mentalhealthamerica.net [employer] on Mental Illness: www.nami.org Contact a health care provider if: You stop taking your antidepressant medicines, and you have any of these symptoms: Nausea. Headache. Light-headedness. Chills and body aches. Not being able to sleep (insomnia). You or your friends and family think your depression is getting worse. Get help right away if: You have thoughts of hurting yourself or others. If you ever feel like you may hurt yourself or others, or have thoughts about taking your own life, get help right away. Go to your nearest emergency department or: Call your local emergency services (911 in the [place] Call a suicide crisis helpline, such as the National Suicide Prevention Lifeline at 8031090814 or 988 in the [place] This is open 24 hours a day in the [place] Text the Crisis Text Line at 916-006-8201 (in the [place] Summary If  you are diagnosed with depression, preparing yourself to manage your symptoms is a good way to feel positive about your future. Work with your health care provider on a management plan that includes stress reduction techniques, medicines (if applicable), therapy, and healthy lifestyle habits. Keep talking with your health care provider about how your treatment is working. If you have thoughts about taking your own life, call a suicide crisis helpline or text a crisis text line. This information is not intended to replace advice given to you by your health care provider. Make sure you discuss any questions you have with your health care provider. Document Revised: 04/24/2021 Document Reviewed: 08/10/2019 Elsevier Patient Education  [place].
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
## 2022-04-25 ENCOUNTER — Ambulatory Visit: Payer: Self-pay

## 2022-04-25 ENCOUNTER — Telehealth: Payer: Self-pay

## 2022-04-25 LAB — CERVICOVAGINAL ANCILLARY ONLY
Bacterial Vaginitis (gardnerella): POSITIVE — AB
Candida Glabrata: NEGATIVE
Candida Vaginitis: NEGATIVE
Chlamydia: NEGATIVE
Comment: NEGATIVE
Comment: NEGATIVE
Comment: NEGATIVE
Comment: NEGATIVE
Comment: NEGATIVE
Comment: NORMAL
Neisseria Gonorrhea: NEGATIVE
Trichomonas: NEGATIVE

## 2022-04-25 NOTE — Telephone Encounter (Signed)
Kayla RN spoke with pt.  Please see phone encounter.

## 2022-04-25 NOTE — Telephone Encounter (Signed)
Pt calling, seen results on Mychart for BV and was wanting to know if anything was going to be prescribed. I explained that Mychart results show quicker and the provider hadn't reviewed results yet but would send request for rx for positive BV and someone will fu with her on Monday. Pt verbalized understanding.

## 2022-04-25 NOTE — Progress Notes (Unsigned)
Patient ID: Colleen Cunningham, female    DOB: 03-02-2001  MRN: [id number]  CC: No chief complaint on file.   Subjective: Colleen Cunningham is a 21 y.o. female who presents for  Her concerns today include:  04/24/2022 [facility] with Maurene Capes, [place] Abrasion Patient given packets of triple antibiotic ointment, patient education given on supportive care.  Most abrasions healing well   Other depression still established with counselor? Patient education given on coping skills.  Patient strongly encouraged to follow-up with counselor.  Patient understands and agrees.  Red flags given for prompt reevaluation   Seasonal allergic rhinitis, unspecified trigger Refill per patient request - cetirizine (ZYRTEC) 10 MG tablet; TAKE 1 TABLET BY MOUTH NIGHTLY AS NEEDED FOR ALLERGY  Dispense: 90 tablet; Refill: 1   History of chlamydia infection Test of cure - Cervicovaginal ancillary only    Patient Active Problem List   Diagnosis Date Noted   Depression 04/24/2022   History of chlamydia infection 01/27/2022   Bacterial vaginitis 11/15/2020   Asthma 03/07/2011   Seasonal allergic rhinitis 03/07/2011     Current Outpatient Medications on File Prior to Visit  Medication Sig Dispense Refill   albuterol (VENTOLIN HFA) 108 (90 Base) MCG/ACT inhaler Inhale 2 puffs into the lungs every 6 (six) hours as needed for wheezing or shortness of breath. (Patient not taking: Reported on 04/24/2022) 8 g 2   carbamide peroxide (DEBROX) 6.5 % OTIC solution Place 5 drops into both ears 2 (two) times daily. (Patient not taking: Reported on 04/24/2022) 15 mL 0   cetirizine (ZYRTEC) 10 MG tablet TAKE 1 TABLET BY MOUTH NIGHTLY AS NEEDED FOR ALLERGY 90 tablet 1   metroNIDAZOLE (FLAGYL) 500 MG tablet Take 1 tablet (500 mg total) by mouth 2 (two) times daily. (Patient not taking: Reported on 04/24/2022) 14 tablet 0   mirtazapine (REMERON) 7.5 MG tablet Take 1 tablet (7.5 mg total) by mouth at bedtime. (Patient not  taking: Reported on 11/11/2021) 90 tablet 0   SUMAtriptan (IMITREX) 25 MG tablet Take 25 mg (1 tablet total) by mouth at the start of the headache. May repeat in 2 hours x 1 if headache persists. Max of 2 tablets/24 hours. (Patient not taking: Reported on 10/11/2021) 30 tablet 0   No current facility-administered medications on file prior to visit.    No Known Allergies  Social History   Socioeconomic History   Marital status: Single    Spouse name: Not on file   Number of children: Not on file   Years of education: Not on file   Highest education level: Not on file  Occupational History   Not on file  Tobacco Use   Smoking status: Never   Smokeless tobacco: Never  Vaping Use   Vaping Use: Never used  Substance and Sexual Activity   Alcohol use: No   Drug use: Yes    Types: Marijuana   Sexual activity: Yes    Birth control/protection: None  Other Topics Concern   Not on file  Social History Narrative   [name]   10th grade   Lives home with dad.   Social Determinants of Health   Financial Resource Strain: Not on file  Food Insecurity: Not on file  Transportation Needs: Not on file  Physical Activity: Not on file  Stress: Not on file  Social Connections: Not on file  Intimate Partner Violence: Not on file    Family History  Problem Relation Age of Onset  Asthma Mother    Hypertension Father 35   Hypertension Paternal Aunt    Hypertension Paternal Uncle    Hypertension Paternal Grandmother    Cancer Paternal Grandmother     Past Surgical History:  Procedure Laterality Date   NO PAST SURGERIES      ROS: Review of Systems Negative except as stated above  PHYSICAL EXAM: LMP 04/22/2022   Physical Exam  {female adult master:310786} {female adult master:310785}      No data to display         Lipid Panel  No results found for: "CHOL", "TRIG", "HDL", "CHOLHDL", "VLDL", "LDLCALC", "LDLDIRECT"  CBC No results found for: "WBC", "RBC",  "HGB", "HCT", "PLT", "MCV", "MCH", "MCHC", "RDW", "LYMPHSABS", "MONOABS", "EOSABS", "BASOSABS"  ASSESSMENT AND PLAN:  There are no diagnoses linked to this encounter.   Patient was given the opportunity to ask questions.  Patient verbalized understanding of the plan and was able to repeat key elements of the plan. Patient was given clear instructions to go to Emergency Department or return to medical center if symptoms don't improve, worsen, or new problems develop.The patient verbalized understanding.   No orders of the defined types were placed in this encounter.    Requested Prescriptions    No prescriptions requested or ordered in this encounter    No follow-ups on file.  Rema Fendt, NP

## 2022-04-26 ENCOUNTER — Telehealth: Payer: Self-pay | Admitting: Physician Assistant

## 2022-04-26 NOTE — Telephone Encounter (Signed)
Copied from CRM [id number]. Topic: General - Inquiry >> Apr 25, 2022  5:05 PM Teressa P wrote: Pt called Primary Care at [facility] saying some on called her about 1:45 from the office.  I looked in patients chart and there were no messages.  I called the office and got Lupita Leash on the phone who said no one there had called her.  She tried to get in touch with the nurse for provider Malissa Hippo.  Lupita Leash  got a nurse on the line who ask her if a CMA could talk to the patient but no one was able to talk to her and if so could only give her the results  and not prescribe anything.  She had a positive on some STD labs from yesterday.  I got off the phone with the office and am sending a CRM to the office and transferring the patient to Nurse Triage to see if they can get in touch with someone to prescribe medications the patient has no seen her results and is asking about medication.

## 2022-04-29 ENCOUNTER — Encounter: Payer: Medicaid Other | Admitting: Family

## 2022-04-29 MED ORDER — METRONIDAZOLE 500 MG PO TABS: 500.0000 mg | ORAL_TABLET | Freq: Two times a day (BID) | ORAL | 0 refills | Status: AC

## 2022-04-29 NOTE — Addendum Note (Signed)
Addended by: Roney Jaffe on: 04/29/2022 08:10 AM   Modules accepted: Orders

## 2022-07-17 ENCOUNTER — Other Ambulatory Visit (HOSPITAL_COMMUNITY)
Admission: RE | Admit: 2022-07-17 | Discharge: 2022-07-17 | Disposition: A | Discharge status: Home / Self Care | Payer: Medicaid Other | Source: Ambulatory Visit | Attending: Obstetrics and Gynecology | Admitting: Obstetrics and Gynecology

## 2022-07-17 ENCOUNTER — Ambulatory Visit (INDEPENDENT_AMBULATORY_CARE_PROVIDER_SITE_OTHER): Payer: Medicaid Other | Admitting: General Practice

## 2022-07-17 VITALS — BP 104/72 | HR 105 | Ht 62.0 in | Wt 121.7 lb

## 2022-07-17 DIAGNOSIS — N898 Other specified noninflammatory disorders of vagina: Secondary | ICD-10-CM

## 2022-07-17 LAB — POCT URINE PREGNANCY: Preg Test, Ur: NEGATIVE

## 2022-07-17 NOTE — Progress Notes (Signed)
SUBJECTIVE:  21 y.o. female complains of white and malodorous vaginal discharge for 1 week(s). Denies abnormal vaginal bleeding or significant pelvic pain or fever. No UTI symptoms. Denies history of known exposure to STD.  Pt requesting UPT today.  No LMP recorded.  OBJECTIVE:  She appears well, afebrile. Urine dipstick: not done.  ASSESSMENT:  Vaginal Discharge  Vaginal Odor   PLAN:  GC, chlamydia, trichomonas, BVAG, CVAG probe sent to lab. Treatment: To be determined once lab results are received ROV prn if symptoms persist or worsen.

## 2022-07-18 LAB — CERVICOVAGINAL ANCILLARY ONLY
Bacterial Vaginitis (gardnerella): POSITIVE — AB
Candida Glabrata: NEGATIVE
Candida Vaginitis: NEGATIVE
Chlamydia: NEGATIVE
Comment: NEGATIVE
Comment: NEGATIVE
Comment: NEGATIVE
Comment: NEGATIVE
Comment: NEGATIVE
Comment: NORMAL
Neisseria Gonorrhea: NEGATIVE
Trichomonas: NEGATIVE

## 2022-07-21 MED ORDER — METRONIDAZOLE 500 MG PO TABS: 500.0000 mg | ORAL_TABLET | Freq: Two times a day (BID) | ORAL | 0 refills | Status: DC

## 2022-07-21 NOTE — Addendum Note (Signed)
Addended by: Mora Bellman on: 07/21/2022 11:19 AM   Modules accepted: Orders

## 2022-08-05 NOTE — Progress Notes (Signed)
Erroneous encounter-disregard

## 2022-08-13 ENCOUNTER — Encounter: Payer: Medicaid Other | Admitting: Family

## 2022-08-13 DIAGNOSIS — F419 Anxiety disorder, unspecified: Secondary | ICD-10-CM

## 2022-09-08 ENCOUNTER — Other Ambulatory Visit (HOSPITAL_COMMUNITY)
Admission: RE | Admit: 2022-09-08 | Discharge: 2022-09-08 | Disposition: A | Discharge status: Home / Self Care | Payer: Medicaid Other | Source: Ambulatory Visit | Attending: Family Medicine | Admitting: Family Medicine

## 2022-09-08 ENCOUNTER — Encounter: Payer: Self-pay | Admitting: Family Medicine

## 2022-09-08 ENCOUNTER — Ambulatory Visit (INDEPENDENT_AMBULATORY_CARE_PROVIDER_SITE_OTHER): Payer: Medicaid Other | Admitting: Family Medicine

## 2022-09-08 VITALS — BP 96/68 | HR 90 | Temp 98.1°F | Resp 16 | Wt 121.0 lb

## 2022-09-08 DIAGNOSIS — F32A Depression, unspecified: Secondary | ICD-10-CM

## 2022-09-08 DIAGNOSIS — Z202 Contact with and (suspected) exposure to infections with a predominantly sexual mode of transmission: Secondary | ICD-10-CM

## 2022-09-08 DIAGNOSIS — N946 Dysmenorrhea, unspecified: Secondary | ICD-10-CM

## 2022-09-08 DIAGNOSIS — F419 Anxiety disorder, unspecified: Secondary | ICD-10-CM | POA: Diagnosis not present

## 2022-09-08 MED ORDER — IBUPROFEN 800 MG PO TABS: 800.0000 mg | ORAL_TABLET | Freq: Three times a day (TID) | ORAL | 3 refills | Status: AC | PRN

## 2022-09-08 NOTE — Progress Notes (Signed)
Established Patient Office Visit  Subjective    Patient ID: Colleen Cunningham, female    DOB: 05/11/2001  Age: 21 y.o. MRN: [id number]  CC:  Chief Complaint  Patient presents with   Exposure to STD    HPI Colleen Cunningham presents for STD check as she has a recent new sexual partner. She denies GU sx.    Outpatient Encounter Medications as of 09/08/2022  Medication Sig   ibuprofen (ADVIL) 800 MG tablet Take 1 tablet (800 mg total) by mouth every 8 (eight) hours as needed for cramping.   albuterol (VENTOLIN HFA) 108 (90 Base) MCG/ACT inhaler Inhale 2 puffs into the lungs every 6 (six) hours as needed for wheezing or shortness of breath. (Patient not taking: Reported on 04/24/2022)   carbamide peroxide (DEBROX) 6.5 % OTIC solution Place 5 drops into both ears 2 (two) times daily. (Patient not taking: Reported on 04/24/2022)   cetirizine (ZYRTEC) 10 MG tablet TAKE 1 TABLET BY MOUTH NIGHTLY AS NEEDED FOR ALLERGY   metroNIDAZOLE (FLAGYL) 500 MG tablet Take 1 tablet (500 mg total) by mouth 2 (two) times daily.   mirtazapine (REMERON) 7.5 MG tablet Take 1 tablet (7.5 mg total) by mouth at bedtime. (Patient not taking: Reported on 11/11/2021)   SUMAtriptan (IMITREX) 25 MG tablet Take 25 mg (1 tablet total) by mouth at the start of the headache. May repeat in 2 hours x 1 if headache persists. Max of 2 tablets/24 hours. (Patient not taking: Reported on 10/11/2021)   No facility-administered encounter medications on file as of 09/08/2022.    Past Medical History:  Diagnosis Date   Allergy    Asthma     Past Surgical History:  Procedure Laterality Date   NO PAST SURGERIES      Family History  Problem Relation Age of Onset   Asthma Mother    Hypertension Father 3   Hypertension Paternal Aunt    Hypertension Paternal Uncle    Hypertension Paternal Grandmother    Cancer Paternal Grandmother     Social History   Socioeconomic History   Marital status: Single    Spouse name: Not on file    Number of children: Not on file   Years of education: Not on file   Highest education level: Not on file  Occupational History   Not on file  Tobacco Use   Smoking status: Never   Smokeless tobacco: Never  Vaping Use   Vaping Use: Never used  Substance and Sexual Activity   Alcohol use: No   Drug use: Yes    Types: Marijuana   Sexual activity: Yes    Birth control/protection: None  Other Topics Concern   Not on file  Social History Narrative   [name]   10th grade   Lives home with dad.   Social Determinants of Health   Financial Resource Strain: Not on file  Food Insecurity: Not on file  Transportation Needs: Not on file  Physical Activity: Not on file  Stress: Not on file  Social Connections: Not on file  Intimate Partner Violence: Not on file    Review of Systems  All other systems reviewed and are negative.       Objective    BP 96/68   Pulse 90   Temp 98.1 F (36.7 C) (Oral)   Resp 16   Wt 121 lb (54.9 kg)   SpO2 98%   BMI 22.13 kg/m   Physical Exam Vitals and nursing note reviewed.  Constitutional:      General: She is not in acute distress. Cardiovascular:     Rate and Rhythm: Normal rate and regular rhythm.  Pulmonary:     Effort: Pulmonary effort is normal.     Breath sounds: Normal breath sounds.  Abdominal:     Palpations: Abdomen is soft.     Tenderness: There is no abdominal tenderness.  Neurological:     General: No focal deficit present.     Mental Status: She is alert and oriented to person, place, and time.         Assessment & Plan:   1. Encounter for assessment of STD exposure  - Cervicovaginal ancillary only  2. Anxiety and depression Patient has been on meds previously but stopped them 2/2 sedation. Patient agrees to follow up with SW for counseling and to consider if she is willing to start zoloft or other med.  3. Dysmenorrhea Ibuprofen 800 mg prescribed    Return in about 3 months (around  12/09/2022).   Tommie Raymond, MD

## 2022-09-09 ENCOUNTER — Telehealth: Payer: Self-pay | Admitting: Licensed Clinical Social Worker

## 2022-09-09 LAB — CERVICOVAGINAL ANCILLARY ONLY
Bacterial Vaginitis (gardnerella): POSITIVE — AB
Candida Glabrata: NEGATIVE
Candida Vaginitis: NEGATIVE
Chlamydia: NEGATIVE
Comment: NEGATIVE
Comment: NEGATIVE
Comment: NEGATIVE
Comment: NEGATIVE
Comment: NEGATIVE
Comment: NORMAL
Neisseria Gonorrhea: NEGATIVE
Trichomonas: NEGATIVE

## 2022-09-09 NOTE — Telephone Encounter (Signed)
Please follow up and evaluate clients needs from notes and client. Provide resources as needed.

## 2022-09-11 ENCOUNTER — Other Ambulatory Visit: Payer: Self-pay | Admitting: Family Medicine

## 2022-09-11 MED ORDER — METRONIDAZOLE 500 MG PO TABS: 500.0000 mg | ORAL_TABLET | Freq: Two times a day (BID) | ORAL | 0 refills | Status: DC

## 2022-10-21 ENCOUNTER — Ambulatory Visit
Admission: EM | Admit: 2022-10-21 | Discharge: 2022-10-21 | Disposition: A | Discharge status: Home / Self Care | Payer: Medicaid Other | Attending: Physician Assistant | Admitting: Physician Assistant

## 2022-10-21 DIAGNOSIS — Z1152 Encounter for screening for COVID-19: Secondary | ICD-10-CM | POA: Diagnosis not present

## 2022-10-21 DIAGNOSIS — R509 Fever, unspecified: Secondary | ICD-10-CM | POA: Diagnosis not present

## 2022-10-21 DIAGNOSIS — J111 Influenza due to unidentified influenza virus with other respiratory manifestations: Secondary | ICD-10-CM

## 2022-10-21 DIAGNOSIS — R051 Acute cough: Secondary | ICD-10-CM | POA: Insufficient documentation

## 2022-10-21 MED ORDER — ALBUTEROL SULFATE HFA 108 (90 BASE) MCG/ACT IN AERS: 2.0000 | INHALATION_SPRAY | Freq: Four times a day (QID) | RESPIRATORY_TRACT | 2 refills | Status: AC | PRN

## 2022-10-21 MED ORDER — OSELTAMIVIR PHOSPHATE 75 MG PO CAPS: 75.0000 mg | ORAL_CAPSULE | Freq: Two times a day (BID) | ORAL | 0 refills | Status: DC

## 2022-10-21 NOTE — ED Triage Notes (Signed)
Pt said x 3 day has been having body aches, coughing, nasal drainage.

## 2022-10-21 NOTE — ED Provider Notes (Signed)
[name]    CSN: [id number] Arrival date & time: 10/21/22  1105      History   Chief Complaint Chief Complaint  Patient presents with   Cough   Nasal Congestion   Generalized Body Aches    HPI Colleen Cunningham is a 22 y.o. female.   22 year old female presents with cough, congestion and fever.  Patient indicates for the past 3 days she has been having upper respiratory symptoms of cough, congestion, rhinitis which is mainly been clear to yellow.  She also indicates she has been having chest congestion with intermittent cough, production has been yellow.  She relates that she has been having fever 100-101, chills, body aches and pain, muscle soreness, and fatigue.  Patient indicates that she has asthma and has an albuterol inhaler but she needs a refill.  She relates she has not had any wheezing or shortness of breath over the past couple days.  She indicates she just does not feel good.  Patient indicates she has not been around any family or friends that been sick however she relates a lot of her coworkers have been sick and have been calling out.  Patient is without nausea or vomiting.   Cough Associated symptoms: chills, fever and rhinorrhea     Past Medical History:  Diagnosis Date   Allergy    Asthma     Patient Active Problem List   Diagnosis Date Noted   Depression 04/24/2022   History of chlamydia infection 01/27/2022   Bacterial vaginitis 11/15/2020   Asthma 03/07/2011   Seasonal allergic rhinitis 03/07/2011    Past Surgical History:  Procedure Laterality Date   NO PAST SURGERIES      OB History   No obstetric history on file.      Home Medications    Prior to Admission medications   Medication Sig Start Date End Date Taking? Authorizing Provider  oseltamivir (TAMIFLU) 75 MG capsule Take 1 capsule (75 mg total) by mouth every 12 (twelve) hours. 10/21/22  Yes Ellsworth Lennox, PA-C  albuterol (VENTOLIN HFA) 108 (90 Base) MCG/ACT inhaler Inhale  2 puffs into the lungs every 6 (six) hours as needed for wheezing or shortness of breath. 10/21/22   Ellsworth Lennox, PA-C  carbamide peroxide (DEBROX) 6.5 % OTIC solution Place 5 drops into both ears 2 (two) times daily. Patient not taking: Reported on 04/24/2022 01/01/22   Dietrich Pates, PA-C  cetirizine (ZYRTEC) 10 MG tablet TAKE 1 TABLET BY MOUTH NIGHTLY AS NEEDED FOR ALLERGY 04/24/22   Mayers, Cari S, PA-C  ibuprofen (ADVIL) 800 MG tablet Take 1 tablet (800 mg total) by mouth every 8 (eight) hours as needed for cramping. 09/08/22   Georganna Skeans, MD  metroNIDAZOLE (FLAGYL) 500 MG tablet Take 1 tablet (500 mg total) by mouth 2 (two) times daily. 09/11/22   Georganna Skeans, MD  mirtazapine (REMERON) 7.5 MG tablet Take 1 tablet (7.5 mg total) by mouth at bedtime. Patient not taking: Reported on 11/11/2021 07/23/21   Georges Mouse, NP  SUMAtriptan (IMITREX) 25 MG tablet Take 25 mg (1 tablet total) by mouth at the start of the headache. May repeat in 2 hours x 1 if headache persists. Max of 2 tablets/24 hours. Patient not taking: Reported on 10/11/2021 09/24/21   Rema Fendt, NP    Family History Family History  Problem Relation Age of Onset   Asthma Mother    Hypertension Father 38   Hypertension Paternal Aunt    Hypertension Paternal  Uncle    Hypertension Paternal Grandmother    Cancer Paternal Grandmother     Social History Social History   Tobacco Use   Smoking status: Never   Smokeless tobacco: Never  Vaping Use   Vaping Use: Never used  Substance Use Topics   Alcohol use: No   Drug use: Yes    Types: Marijuana     Allergies   Patient has no known allergies.   Review of Systems Review of Systems  Constitutional:  Positive for chills, fatigue and fever.  HENT:  Positive for postnasal drip and rhinorrhea.   Respiratory:  Positive for cough.      Physical Exam Triage Vital Signs ED Triage Vitals  Enc Vitals Group     BP 10/21/22 1124 115/81     Pulse Rate  10/21/22 1124 (!) 134     Resp 10/21/22 1124 16     Temp 10/21/22 1124 100 F (37.8 C)     Temp Source 10/21/22 1124 Oral     SpO2 10/21/22 1124 95 %     Weight --      Height --      Head Circumference --      Peak Flow --      Pain Score 10/21/22 1122 4     Pain Loc --      Pain Edu? --      Excl. in [place]? --    No data found.  Updated Vital Signs BP 115/81 (BP Location: Left Arm)   Pulse (!) 134   Temp 100 F (37.8 C) (Oral)   Resp 16   LMP 10/20/2022 (Exact Date)   SpO2 95%   Visual Acuity Right Eye Distance:   Left Eye Distance:   Bilateral Distance:    Right Eye Near:   Left Eye Near:    Bilateral Near:     Physical Exam Constitutional:      Appearance: Normal appearance.  HENT:     Right Ear: Tympanic membrane and ear canal normal.     Left Ear: Tympanic membrane and ear canal normal.     Mouth/Throat:     Mouth: Mucous membranes are moist.     Pharynx: Oropharynx is clear. No posterior oropharyngeal erythema.  Cardiovascular:     Rate and Rhythm: Normal rate and regular rhythm.     Heart sounds: Normal heart sounds.  Pulmonary:     Effort: Pulmonary effort is normal.     Breath sounds: Normal breath sounds and air entry. No wheezing, rhonchi or rales.  Lymphadenopathy:     Cervical: No cervical adenopathy.  Neurological:     Mental Status: She is alert.      UC Treatments / Results  Labs (all labs ordered are listed, but only abnormal results are displayed) Labs Reviewed  SARS CORONAVIRUS 2 (TAT 6-24 HRS)    EKG   Radiology No results found.  Procedures Procedures (including critical care time)  Medications Ordered in UC Medications - No data to display  Initial Impression / Assessment and Plan / UC Course  I have reviewed the triage vital signs and the nursing notes.  Pertinent labs & imaging results that were available during my care of the patient were reviewed by me and considered in my medical decision making (see chart for  details).    Plan: 1.  Screening for COVID-19 will be treated with the following: A.  Treatment may be considered depending on results of COVID-19 testing. 2.  The  flu will be treated with the following: A.  Tamiflu 75 mg every 12 hours to treat the flu. 3.  The acute cough will be treated with the following: A.  Advised patient to use an OTC cough preparations to control the cough. B.  Albuterol inhaler, 2 puffs every 6 hours on a regular basis as needed for cough, wheezing, shortness of breath. 4.  The fever be treated with the following: A.  Advised patient to take ibuprofen or Tylenol to help control fever and body aches. 5.  Advised to follow-up with PCP or return to urgent care if symptoms fail to improve. Final Clinical Impressions(s) / UC Diagnoses   Final diagnoses:  Fever, unspecified  Acute cough  Flu  Encounter for screening for COVID-19     Discharge Instructions      Advised take Tamiflu 75 mg every 12 hours to treat the flu.  Taking this medicine should help you to feel better within the next 48 hours. Advised to use the albuterol inhaler as needed for wheezing and shortness of breath, 2 puffs every 6 hours as needed. Advised take ibuprofen or Tylenol as needed for fever, aches and pains and body discomfort. Advised to continue take OTC cough preparations to control the cough and congestion. Advised to rest for the next couple days, follow-up with PCP or return to urgent care if symptoms fail to improve.    ED Prescriptions     Medication Sig Dispense Auth. Provider   albuterol (VENTOLIN HFA) 108 (90 Base) MCG/ACT inhaler Inhale 2 puffs into the lungs every 6 (six) hours as needed for wheezing or shortness of breath. 8 g Ellsworth Lennox, PA-C   oseltamivir (TAMIFLU) 75 MG capsule Take 1 capsule (75 mg total) by mouth every 12 (twelve) hours. 10 capsule Ellsworth Lennox, PA-C      PDMP not reviewed this encounter.   Ellsworth Lennox, PA-C 10/21/22 1158

## 2022-10-21 NOTE — Discharge Instructions (Addendum)
Advised take Tamiflu 75 mg every 12 hours to treat the flu.  Taking this medicine should help you to feel better within the next 48 hours. Advised to use the albuterol inhaler as needed for wheezing and shortness of breath, 2 puffs every 6 hours as needed. Advised take ibuprofen or Tylenol as needed for fever, aches and pains and body discomfort. Advised to continue take OTC cough preparations to control the cough and congestion. Advised to rest for the next couple days, follow-up with PCP or return to urgent care if symptoms fail to improve.

## 2022-10-22 LAB — SARS CORONAVIRUS 2 (TAT 6-24 HRS): SARS Coronavirus 2: NEGATIVE

## 2022-11-13 NOTE — Telephone Encounter (Signed)
I called patient today to introduce [facility] and to assess patients' mental health needs. Patient did not answer the phone. I was not able to leave a brief message as they voicemail box is full. Will call again to confirm needs for counseling or outside resources for mental health.  Patient was referred by PCP for anxiety and depression

## 2022-12-15 ENCOUNTER — Ambulatory Visit: Payer: Medicaid Other

## 2022-12-15 ENCOUNTER — Telehealth: Payer: Self-pay

## 2022-12-15 ENCOUNTER — Other Ambulatory Visit (HOSPITAL_COMMUNITY)
Admission: RE | Admit: 2022-12-15 | Discharge: 2022-12-15 | Disposition: A | Discharge status: Home / Self Care | Payer: Medicaid Other | Source: Ambulatory Visit | Attending: Family | Admitting: Family

## 2022-12-15 DIAGNOSIS — Z202 Contact with and (suspected) exposure to infections with a predominantly sexual mode of transmission: Secondary | ICD-10-CM

## 2022-12-15 NOTE — Telephone Encounter (Signed)
Spoke to pt nurse visit scheduled for today due to exposure to STD and no available appts.

## 2022-12-17 LAB — CERVICOVAGINAL ANCILLARY ONLY
Bacterial Vaginitis (gardnerella): POSITIVE — AB
Candida Glabrata: NEGATIVE
Candida Vaginitis: NEGATIVE
Chlamydia: NEGATIVE
Comment: NEGATIVE
Comment: NEGATIVE
Comment: NEGATIVE
Comment: NEGATIVE
Comment: NEGATIVE
Comment: NORMAL
Neisseria Gonorrhea: NEGATIVE
Trichomonas: NEGATIVE

## 2022-12-19 ENCOUNTER — Other Ambulatory Visit: Payer: Self-pay | Admitting: Family Medicine

## 2022-12-19 MED ORDER — METRONIDAZOLE 500 MG PO TABS: 500.0000 mg | ORAL_TABLET | Freq: Two times a day (BID) | ORAL | 0 refills | Status: AC

## 2023-04-09 ENCOUNTER — Other Ambulatory Visit: Payer: Self-pay | Admitting: Family

## 2023-04-09 DIAGNOSIS — J302 Other seasonal allergic rhinitis: Secondary | ICD-10-CM
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
## 2023-04-24 ENCOUNTER — Other Ambulatory Visit (HOSPITAL_COMMUNITY)
Admission: RE | Admit: 2023-04-24 | Discharge: 2023-04-24 | Disposition: A | Discharge status: Home / Self Care | Payer: Medicaid Other | Source: Ambulatory Visit | Attending: Obstetrics and Gynecology | Admitting: Obstetrics and Gynecology

## 2023-04-24 ENCOUNTER — Ambulatory Visit: Payer: Medicaid Other

## 2023-04-24 VITALS — BP 108/73 | HR 76 | Ht 62.0 in | Wt 123.0 lb

## 2023-04-24 DIAGNOSIS — Z113 Encounter for screening for infections with a predominantly sexual mode of transmission: Secondary | ICD-10-CM

## 2023-04-24 NOTE — Progress Notes (Signed)
SUBJECTIVE:  22 y.o. female who desires a STI screen. Denies abnormal vaginal discharge, bleeding or significant pelvic pain. No UTI symptoms. Denies history of known exposure to STD.  Patient's last menstrual period was 04/23/2023 (exact date).  OBJECTIVE:  She appears well.   ASSESSMENT:  STI Screen   PLAN:  Pt offered STI blood screening-declined GC, chlamydia, and trichomonas probe sent to lab.  Treatment: To be determined once lab results are received.  Pt follow up as needed.

## 2023-04-27 LAB — CERVICOVAGINAL ANCILLARY ONLY
Chlamydia: NEGATIVE
Comment: NEGATIVE
Comment: NEGATIVE
Comment: NORMAL
Neisseria Gonorrhea: NEGATIVE
Trichomonas: NEGATIVE

## 2023-07-10 ENCOUNTER — Other Ambulatory Visit: Payer: Self-pay

## 2023-07-10 ENCOUNTER — Ambulatory Visit
Admission: EM | Admit: 2023-07-10 | Discharge: 2023-07-10 | Disposition: A | Discharge status: Home / Self Care | Payer: Self-pay | Attending: Physician Assistant | Admitting: Physician Assistant

## 2023-07-10 ENCOUNTER — Encounter: Payer: Self-pay | Admitting: *Deleted

## 2023-07-10 DIAGNOSIS — J069 Acute upper respiratory infection, unspecified: Secondary | ICD-10-CM | POA: Insufficient documentation

## 2023-07-10 DIAGNOSIS — Z1152 Encounter for screening for COVID-19: Secondary | ICD-10-CM | POA: Insufficient documentation

## 2023-07-10 NOTE — ED Triage Notes (Signed)
Cough, congestion, hot/cold since tuesday

## 2023-07-11 LAB — SARS CORONAVIRUS 2 (TAT 6-24 HRS): SARS Coronavirus 2: NEGATIVE

## 2023-07-14 ENCOUNTER — Encounter: Payer: Self-pay | Admitting: Physician Assistant

## 2023-07-14 NOTE — ED Provider Notes (Signed)
[name]    CSN: [id number] Arrival date & time: 07/10/23  1331      History   Chief Complaint Chief Complaint  Patient presents with   Cough    HPI Colleen Cunningham is a 22 y.o. female.   Patient here today for evaluation of cough, congestion, feeling hot and cold for the last few days.  She denies any vomiting or diarrhea.  She has taken over-the-counter medication without resolution.  The history is provided by the patient.  Cough Associated symptoms: chills, diaphoresis and sore throat   Associated symptoms: no ear pain, no eye discharge, no fever, no shortness of breath and no wheezing     Past Medical History:  Diagnosis Date   Allergy    Asthma     Patient Active Problem List   Diagnosis Date Noted   Depression 04/24/2022   History of chlamydia infection 01/27/2022   Bacterial vaginitis 11/15/2020   Asthma 03/07/2011   Seasonal allergic rhinitis 03/07/2011    Past Surgical History:  Procedure Laterality Date   NO PAST SURGERIES      OB History   No obstetric history on file.      Home Medications    Prior to Admission medications   Medication Sig Start Date End Date Taking? Authorizing Provider  albuterol (VENTOLIN HFA) 108 (90 Base) MCG/ACT inhaler Inhale 2 puffs into the lungs every 6 (six) hours as needed for wheezing or shortness of breath. 10/21/22  Yes Ellsworth Lennox, PA-C  cetirizine (ZYRTEC) 10 MG tablet TAKE 1 TABLET BY MOUTH NIGHTLY AS NEEDED FOR ALLERGY 04/10/23  Yes Zonia Kief, Amy J, NP  ibuprofen (ADVIL) 800 MG tablet Take 1 tablet (800 mg total) by mouth every 8 (eight) hours as needed for cramping. 09/08/22  Yes Georganna Skeans, MD    Family History Family History  Problem Relation Age of Onset   Asthma Mother    Hypertension Father 66   Hypertension Paternal Aunt    Hypertension Paternal Uncle    Hypertension Paternal Grandmother    Cancer Paternal Grandmother     Social History Social History   Tobacco Use    Smoking status: Never   Smokeless tobacco: Never  Vaping Use   Vaping status: Never Used  Substance Use Topics   Alcohol use: No   Drug use: Yes    Types: Marijuana     Allergies   Patient has no known allergies.   Review of Systems Review of Systems  Constitutional:  Positive for chills and diaphoresis. Negative for fever.  HENT:  Positive for congestion and sore throat. Negative for ear pain.   Eyes:  Negative for discharge and redness.  Respiratory:  Positive for cough. Negative for shortness of breath and wheezing.   Gastrointestinal:  Negative for abdominal pain, diarrhea, nausea and vomiting.     Physical Exam Triage Vital Signs ED Triage Vitals  Encounter Vitals Group     BP 07/10/23 1344 114/74     Systolic BP Percentile --      Diastolic BP Percentile --      Pulse Rate 07/10/23 1344 (!) 112     Resp 07/10/23 1344 18     Temp 07/10/23 1344 98.7 F (37.1 C)     Temp Source 07/10/23 1344 Oral     SpO2 07/10/23 1344 95 %     Weight 07/10/23 1342 120 lb (54.4 kg)     Height 07/10/23 1342 5\' 2"  (1.575 m)     Head  Circumference --      Peak Flow --      Pain Score 07/10/23 1342 0     Pain Loc --      Pain Education --      Exclude from Growth Chart --    No data found.  Updated Vital Signs BP 114/74 (BP Location: Left Arm)   Pulse (!) 112   Temp 98.7 F (37.1 C) (Oral)   Resp 18   Ht 5\' 2"  (1.575 m)   Wt 120 lb (54.4 kg)   LMP 07/07/2023   SpO2 97%   BMI 21.95 kg/m       Physical Exam Vitals and nursing note reviewed.  Constitutional:      General: She is not in acute distress.    Appearance: Normal appearance. She is not ill-appearing.  HENT:     Head: Normocephalic and atraumatic.     Nose: Congestion present.     Mouth/Throat:     Mouth: Mucous membranes are moist.     Pharynx: No oropharyngeal exudate or posterior oropharyngeal erythema.  Eyes:     Conjunctiva/sclera: Conjunctivae normal.  Cardiovascular:     Rate and Rhythm: Normal  rate and regular rhythm.     Heart sounds: Normal heart sounds. No murmur heard. Pulmonary:     Effort: Pulmonary effort is normal. No respiratory distress.     Breath sounds: Normal breath sounds. No wheezing, rhonchi or rales.  Skin:    General: Skin is warm and dry.  Neurological:     Mental Status: She is alert.  Psychiatric:        Mood and Affect: Mood normal.        Thought Content: Thought content normal.      UC Treatments / Results  Labs (all labs ordered are listed, but only abnormal results are displayed) Labs Reviewed  SARS CORONAVIRUS 2 (TAT 6-24 HRS)    EKG   Radiology No results found.  Procedures Procedures (including critical care time)  Medications Ordered in UC Medications - No data to display  Initial Impression / Assessment and Plan / UC Course  I have reviewed the triage vital signs and the nursing notes.  Pertinent labs & imaging results that were available during my care of the patient were reviewed by me and considered in my medical decision making (see chart for details).    Suspect viral etiology of symptoms.  Will screen for COVID.  Recommended symptomatic treatment, increase fluids and rest.  Encouraged follow-up if no gradual improvement or with any further concerns.  Final Clinical Impressions(s) / UC Diagnoses   Final diagnoses:  Acute upper respiratory infection   Discharge Instructions   None    ED Prescriptions   None    PDMP not reviewed this encounter.   Tomi Bamberger, PA-C 07/14/23 1255

## 2023-10-24 ENCOUNTER — Emergency Department (HOSPITAL_BASED_OUTPATIENT_CLINIC_OR_DEPARTMENT_OTHER)
Admission: EM | Admit: 2023-10-24 | Discharge: 2023-10-24 | Disposition: A | Discharge status: Home / Self Care | Payer: Self-pay

## 2023-10-24 DIAGNOSIS — S61012A Laceration without foreign body of left thumb without damage to nail, initial encounter: Secondary | ICD-10-CM | POA: Insufficient documentation

## 2023-10-24 DIAGNOSIS — J45909 Unspecified asthma, uncomplicated: Secondary | ICD-10-CM | POA: Insufficient documentation

## 2023-10-24 DIAGNOSIS — W268XXA Contact with other sharp object(s), not elsewhere classified, initial encounter: Secondary | ICD-10-CM | POA: Insufficient documentation

## 2023-10-24 MED ORDER — LIDOCAINE-EPINEPHRINE-TETRACAINE (LET) TOPICAL GEL
3.0000 mL | Freq: Once | TOPICAL | Status: AC
Start: 2023-10-24 — End: 2023-10-24
  Administered 2023-10-24: 3 mL via TOPICAL
  Filled 2023-10-24: qty 3

## 2023-10-24 NOTE — ED Provider Notes (Signed)
 [place] EMERGENCY DEPARTMENT AT [facility] Provider Note   CSN: [id number] Arrival date & time: 10/24/23  1615     History  Chief Complaint  Patient presents with   Finger Injury    Colleen Cunningham is a 23 y.o. female with history of asthma who presents to the ER complaining of a laceration to the left thumb while trying to open a can, sustained about an hour prior to ER arrival. Tetanus up to date.   HPI     Home Medications Prior to Admission medications   Medication Sig Start Date End Date Taking? Authorizing Provider  albuterol  (VENTOLIN  HFA) 108 (90 Base) MCG/ACT inhaler Inhale 2 puffs into the lungs every 6 (six) hours as needed for wheezing or shortness of breath. 10/21/22   Lynwood Lenis, PA-C  cetirizine  (ZYRTEC ) 10 MG tablet TAKE 1 TABLET BY MOUTH NIGHTLY AS NEEDED FOR ALLERGY 04/10/23   Lorren Greig PARAS, NP  ibuprofen  (ADVIL ) 800 MG tablet Take 1 tablet (800 mg total) by mouth every 8 (eight) hours as needed for cramping. 09/08/22   Tanda Bleacher, MD      Allergies    Patient has no known allergies.    Review of Systems   Review of Systems  Skin:  Positive for wound.  All other systems reviewed and are negative.   Physical Exam Updated Vital Signs BP 106/65 (BP Location: Right Arm)   Pulse 84   Temp 98.7 F (37.1 C) (Oral)   Resp 14   LMP 10/13/2023 (Approximate)   SpO2 100%  Physical Exam Vitals and nursing note reviewed.  Constitutional:      Appearance: Normal appearance.  HENT:     Head: Normocephalic and atraumatic.  Eyes:     Conjunctiva/sclera: Conjunctivae normal.  Pulmonary:     Effort: Pulmonary effort is normal. No respiratory distress.  Musculoskeletal:     Comments: Normal ROM of the digits, normal sensation  Skin:    General: Skin is warm and dry.     Capillary Refill: Capillary refill takes less than 2 seconds.     Comments: U shaped laceration to the pad of the L thumb, about 1 cm in length, bleeding is controlled   Neurological:     Mental Status: She is alert.  Psychiatric:        Mood and Affect: Mood normal.        Behavior: Behavior normal.     ED Results / Procedures / Treatments   Labs (all labs ordered are listed, but only abnormal results are displayed) Labs Reviewed - No data to display  EKG None  Radiology No results found.  Procedures .Laceration Repair  Date/Time: 10/24/2023 5:18 PM  Performed by: Corie Friddie DASEN, PA-C Authorized by: Zayon Trulson T, PA-C   Consent:    Consent obtained:  Verbal   Consent given by:  Patient   Risks, benefits, and alternatives were discussed: yes     Risks discussed:  Infection, pain and poor wound healing Universal protocol:    Procedure explained and questions answered to patient or proxy's satisfaction: yes     Patient identity confirmed:  Provided demographic data Anesthesia:    Anesthesia method:  Topical application   Topical anesthetic:  LET Laceration details:    Location:  Finger   Finger location:  L thumb   Length (cm):  1 Pre-procedure details:    Preparation:  Patient was prepped and draped in usual sterile fashion Exploration:    Limited defect created (  wound extended): no     Hemostasis achieved with:  LET   Wound exploration: entire depth of wound visualized     Contaminated: no   Treatment:    Area cleansed with:  Chlorhexidine   Amount of cleaning:  Standard   Irrigation solution:  Sterile saline   Visualized foreign bodies/material removed: no     Debridement:  None   Undermining:  None   Scar revision: no   Skin repair:    Repair method:  Tissue adhesive Approximation:    Approximation:  Close Repair type:    Repair type:  Simple Post-procedure details:    Dressing:  Adhesive bandage   Procedure completion:  Tolerated well, no immediate complications     Medications Ordered in ED Medications  lidocaine -EPINEPHrine -tetracaine  (LET) topical gel (3 mLs Topical Given 10/24/23 1644)    ED  Course/ Medical Decision Making/ A&P                                 Medical Decision Making  Patient is a 23 y.o. female who presents to the emergency department with concern for L thumb laceration. Wound occurred 1 hr prior to ER arrival.   Physical exam: 1 cm U shaped laceration to the pad of the L thumb, fairly superficial  Procedure: Wound explored and base of wound visualized in a bloodless field without evidence of foreign body. Laceration was anesthestized with LET gel and closed with dermabond. Patient tolerated procedure well with no immediate complications. Tdap was up to date.   Disposition: Patient has  no comorbidities to effect normal wound healing. Patient discharged  without antibiotics.  Discussed suture home care with patient and answered questions. Patient to return to the ED for signs of infection. Pt is hemodynamically stable with no complaints prior to discharge.  Final Clinical Impression(s) / ED Diagnoses Final diagnoses:  Laceration of left thumb without foreign body without damage to nail, initial encounter    Rx / DC Orders ED Discharge Orders     None      Portions of this report may have been transcribed using voice recognition software. Every effort was made to ensure accuracy; however, inadvertent computerized transcription errors may be present.    Evens Meno T, PA-C 10/24/23 1718    Ula Prentice SAUNDERS, MD 10/25/23 1530

## 2023-10-24 NOTE — ED Triage Notes (Signed)
 Pt sliced left thumb with a sharp edge of a can while attempting to open the can.  Bleeding controlled by pressure dressing in triage.

## 2023-10-24 NOTE — ED Notes (Signed)
 1inch laceratoin noted to distal left thumb.  Minimal active bleeding in triage. Wound rinsed and new dressing applied. Awaiting provider eval.

## 2023-10-24 NOTE — Discharge Instructions (Signed)
 You were seen in the emergency department for thumb laceration.   We were able to close your laceration with skin glue. The adhesive should peel off in about 5 to 10 days.  If it comes off sooner that is okay.  If it lasts longer than that, you can use some Vaseline to help it come off on its own.  With the glue, you may shower, but do not soak or scrub the area for 7 to 10 days.  Then make sure that you pat the area dry.   If you want to wear a bandage over the area that is fine as well, but make sure it is clean/dry (has no ointment on it).  Watch out for signs of infection like we discussed, including: increased redness, tenderness, or drainage of pus from the site. If this happens and you were not prescribed antibiotics, please seek medical attention.   You can take over the counter pain medicine like ibuprofen  or tylenol  as needed.

## 2024-04-21 ENCOUNTER — Encounter (HOSPITAL_BASED_OUTPATIENT_CLINIC_OR_DEPARTMENT_OTHER): Payer: Self-pay | Admitting: Emergency Medicine

## 2024-04-21 ENCOUNTER — Other Ambulatory Visit: Payer: Self-pay

## 2024-04-21 ENCOUNTER — Emergency Department (HOSPITAL_BASED_OUTPATIENT_CLINIC_OR_DEPARTMENT_OTHER)
Admission: EM | Admit: 2024-04-21 | Discharge: 2024-04-21 | Disposition: A | Discharge status: Home / Self Care | Payer: Self-pay | Attending: Emergency Medicine | Admitting: Emergency Medicine

## 2024-04-21 DIAGNOSIS — L0591 Pilonidal cyst without abscess: Secondary | ICD-10-CM | POA: Insufficient documentation

## 2024-04-21 MED ORDER — LIDOCAINE-EPINEPHRINE (PF) 2 %-1:200000 IJ SOLN
10.0000 mL | Freq: Once | INTRAMUSCULAR | Status: AC
  Administered 2024-04-21: 10 mL
  Filled 2024-04-21: qty 20

## 2024-04-21 MED ORDER — KETOROLAC TROMETHAMINE 30 MG/ML IJ SOLN
30.0000 mg | Freq: Once | INTRAMUSCULAR | Status: AC
  Administered 2024-04-21: 30 mg via INTRAMUSCULAR
  Filled 2024-04-21: qty 1

## 2024-04-21 NOTE — Discharge Instructions (Signed)
 Please read and follow all provided instructions.  Your diagnoses today include:  1. Pilonidal cyst     Tests performed today include: Vital signs. See below for your results today.   Medications prescribed:  Please use over-the-counter NSAID medications (ibuprofen , naproxen) or Tylenol (acetaminophen) as directed on the packaging for pain -- as long as you do not have any reasons avoid these medications. Reasons to avoid NSAID medications include: weak kidneys, a history of bleeding in your stomach or gut, or uncontrolled high blood pressure or previous heart attack. Reasons to avoid Tylenol include: liver problems or ongoing alcohol use. Never take more than 4000mg  or 8 Extra strength Tylenol in a 24 hour period.     Take any prescribed medications only as directed.   Home care instructions:  Follow any educational materials contained in this packet  Follow-up instructions: Return to the Emergency Department in 48 hours for a recheck if your symptoms are not significantly improved.  Please follow-up with your primary care provider in the next 1 week for further evaluation of your symptoms.   Return instructions:  Return to the Emergency Department if you have: Fever Worsening symptoms Worsening pain Worsening swelling Redness of the skin that moves away from the affected area, especially if it streaks away from the affected area  Any other emergent concerns  Your vital signs today were: BP 118/86   Pulse (!) 139   Temp 98.3 F (36.8 C) (Oral)   Resp 16   SpO2 100%  If your blood pressure (BP) was elevated above 135/85 this visit, please have this repeated by your doctor within one month. --------------

## 2024-04-21 NOTE — ED Triage Notes (Signed)
 Hard to lie down on her back.

## 2024-04-21 NOTE — ED Triage Notes (Signed)
 Sacral pain ,hurts every June for past 5 years. Had an xray for this in 2020 but doesn't remember what was said. Tried ibuprofen /tylenol/ heat and cold , hasn't helped.

## 2024-04-21 NOTE — ED Notes (Signed)
 Wound bandaged, female witness in the room.SABRASABRA

## 2024-04-21 NOTE — ED Notes (Signed)
 DC paperwork given and verbally understood.

## 2024-04-21 NOTE — ED Provider Notes (Signed)
 [place] EMERGENCY DEPARTMENT AT [facility] Provider Note   CSN: [id number] Arrival date & time: 04/21/24  1121     Patient presents with: No chief complaint on file.   Colleen Cunningham is a 23 y.o. female.   Patient presents to the emergency department for evaluation of pain and swelling at the top of the buttocks over the past several days.  Patient has had this recurrently but has never had an incision and drainage.  She reports having an x-ray in the past that was negative.  Patient states that it hurts to walk and sit.  No fevers.       Prior to Admission medications   Medication Sig Start Date End Date Taking? Authorizing Provider  albuterol  (VENTOLIN  HFA) 108 (90 Base) MCG/ACT inhaler Inhale 2 puffs into the lungs every 6 (six) hours as needed for wheezing or shortness of breath. 10/21/22   Lynwood Lenis, PA-C  cetirizine  (ZYRTEC ) 10 MG tablet TAKE 1 TABLET BY MOUTH NIGHTLY AS NEEDED FOR ALLERGY 04/10/23   Lorren Greig PARAS, NP  ibuprofen  (ADVIL ) 800 MG tablet Take 1 tablet (800 mg total) by mouth every 8 (eight) hours as needed for cramping. 09/08/22   Tanda Bleacher, MD    Allergies: Patient has no known allergies.    Review of Systems  Updated Vital Signs BP 118/86   Pulse (!) 139   Temp 98.3 F (36.8 C) (Oral)   Resp 16   SpO2 100%   Physical Exam Vitals and nursing note reviewed.  Constitutional:      Appearance: She is well-developed.     Comments: Tearful  HENT:     Head: Normocephalic and atraumatic.  Eyes:     Conjunctiva/sclera: Conjunctivae normal.  Pulmonary:     Effort: No respiratory distress.  Musculoskeletal:     Cervical back: Normal range of motion and neck supple.  Skin:    General: Skin is warm and dry.     Comments: Patient has a 2-3 cm area of induration, palpable fluctuance, pilonidal area.  No significant surrounding cellulitis.  Neurological:     Mental Status: She is alert.     (all labs ordered are listed, but only  abnormal results are displayed) Labs Reviewed - No data to display  EKG: None  Radiology: No results found.   .Incision and Drainage  Date/Time: 04/21/2024 12:18 PM  Performed by: Desiderio Chew, PA-C Authorized by: Desiderio Chew, PA-C   Consent:    Consent obtained:  Verbal   Consent given by:  Patient   Risks discussed:  Bleeding, incomplete drainage, pain and infection Universal protocol:    Patient identity confirmed:  Verbally with patient and provided demographic data Location:    Type:  Pilonidal cyst   Size:  3cm   Location:  Anogenital   Anogenital location:  Pilonidal Pre-procedure details:    Skin preparation:  Chlorhexidine Anesthesia:    Anesthesia method:  Local infiltration   Local anesthetic:  Lidocaine  2% WITH epi Procedure type:    Complexity:  Simple Procedure details:    Incision types:  Stab incision   Wound management:  Probed and deloculated   Drainage:  Purulent   Drainage amount:  Copious   Wound treatment:  Wound left open   Packing materials:  None Post-procedure details:    Procedure completion:  Tolerated well, no immediate complications    Medications Ordered in the ED  ketorolac  (TORADOL ) 30 MG/ML injection 30 mg (has no administration in time range)  lidocaine -EPINEPHrine  (XYLOCAINE  W/EPI) 2 %-1:200000 (PF) injection 10 mL (has no administration in time range)   ED Course  Patient seen and examined. History obtained directly from patient.   Labs/EKG: None ordered  Imaging: None ordered  Medications/Fluids: Ordered: Lidocaine  with epinephrine , Toradol   Most recent vital signs reviewed and are as follows: BP 118/86   Pulse (!) 139   Temp 98.3 F (36.8 C) (Oral)   Resp 16   SpO2 100%   Initial impression: Pilonidal cyst, inflamed  We discussed incision and drainage procedure.  She is anxious but agrees to proceed.   12:19 PM Reassessment performed. Patient appears stable. She tolerated the procedure well.  We  discussed doing warm soaks 2-3 times a day for the next few days.  Most current vital signs reviewed and are as follows: BP 111/73 (BP Location: Right Arm)   Pulse (!) 103   Temp 98.3 F (36.8 C) (Oral)   Resp 18   SpO2 100%   Plan: Discharge to home.   Prescriptions written for: None  The patient was urged to return to the Emergency Department urgently with worsening pain, swelling, expanding erythema especially if it streaks away from the affected area, fever, or if they have any other concerns.   The patient was urged to return to the Emergency Department or go to their PCP in 48 hours for wound recheck if the area is not significantly improved.  The patient verbalized understanding and stated agreement with this plan.                                    Medical Decision Making Risk Prescription drug management.   Patient with pilonidal abscess, drained.  No immediate complications.  Do not feel patient requires antibiotics at this time.       Final diagnoses:  Pilonidal cyst    ED Discharge Orders     None          Desiderio Chew, PA-C 04/21/24 1221    Elnor Savant A, DO 04/21/24 1423

## 2024-04-21 NOTE — ED Triage Notes (Signed)
 Area swollen/

## 2024-05-30 ENCOUNTER — Other Ambulatory Visit: Payer: Self-pay

## 2024-05-30 ENCOUNTER — Encounter (HOSPITAL_BASED_OUTPATIENT_CLINIC_OR_DEPARTMENT_OTHER): Payer: Self-pay | Admitting: Emergency Medicine

## 2024-05-30 DIAGNOSIS — J45909 Unspecified asthma, uncomplicated: Secondary | ICD-10-CM | POA: Insufficient documentation

## 2024-05-30 DIAGNOSIS — N3000 Acute cystitis without hematuria: Secondary | ICD-10-CM | POA: Insufficient documentation

## 2024-05-30 LAB — COMPREHENSIVE METABOLIC PANEL WITH GFR
ALT: <5 U/L (ref 0–44)
AST: 15 U/L (ref 15–41)
Albumin: 4 g/dL (ref 3.5–5.0)
Alkaline Phosphatase: 67 U/L (ref 38–126)
Anion gap: 14 (ref 5–15)
BUN: 6 mg/dL (ref 6–20)
CO2: 21 mmol/L — ABNORMAL LOW (ref 22–32)
Calcium: 9.3 mg/dL (ref 8.9–10.3)
Chloride: 104 mmol/L (ref 98–111)
Creatinine, Ser: 0.77 mg/dL (ref 0.44–1.00)
GFR, Estimated: >60 mL/min (ref >60)
Glucose, Bld: 102 mg/dL — ABNORMAL HIGH (ref 70–99)
Potassium: 3.7 mmol/L (ref 3.5–5.1)
Sodium: 139 mmol/L (ref 135–145)
Total Bilirubin: 0.3 mg/dL (ref 0.0–1.2)
Total Protein: 7.5 g/dL (ref 6.5–8.1)

## 2024-05-30 LAB — CBC
HCT: 34.4 % — ABNORMAL LOW (ref 36.0–46.0)
Hemoglobin: 11.4 g/dL — ABNORMAL LOW (ref 12.0–15.0)
MCH: 29.4 pg (ref 26.0–34.0)
MCHC: 33.1 g/dL (ref 30.0–36.0)
MCV: 88.7 fL (ref 80.0–100.0)
Platelets: 457 K/uL — ABNORMAL HIGH (ref 150–400)
RBC: 3.88 MIL/uL (ref 3.87–5.11)
RDW: 14.9 % (ref 11.5–15.5)
WBC: 8.4 K/uL (ref 4.0–10.5)
nRBC: 0 % (ref 0.0–0.2)

## 2024-05-30 LAB — HCG, QUANTITATIVE, PREGNANCY: hCG, Beta Chain, Quant, S: <1 m[IU]/mL (ref <5)

## 2024-05-30 LAB — LIPASE, BLOOD: Lipase: 17 U/L (ref 11–51)

## 2024-05-30 NOTE — ED Triage Notes (Signed)
 Sharp pains near umbilicus x 1 weeks Reports menstrual starting 6 days early

## 2024-05-31 ENCOUNTER — Emergency Department (HOSPITAL_BASED_OUTPATIENT_CLINIC_OR_DEPARTMENT_OTHER): Payer: Self-pay

## 2024-05-31 ENCOUNTER — Emergency Department (HOSPITAL_BASED_OUTPATIENT_CLINIC_OR_DEPARTMENT_OTHER)
Admission: RE | Admit: 2024-05-31 | Discharge: 2024-05-31 | Disposition: A | Discharge status: Home / Self Care | Payer: Self-pay | Source: Ambulatory Visit | Attending: Emergency Medicine | Admitting: Emergency Medicine

## 2024-05-31 ENCOUNTER — Other Ambulatory Visit (HOSPITAL_BASED_OUTPATIENT_CLINIC_OR_DEPARTMENT_OTHER): Payer: Self-pay

## 2024-05-31 ENCOUNTER — Telehealth (HOSPITAL_BASED_OUTPATIENT_CLINIC_OR_DEPARTMENT_OTHER): Payer: Self-pay | Admitting: Emergency Medicine

## 2024-05-31 ENCOUNTER — Emergency Department (HOSPITAL_BASED_OUTPATIENT_CLINIC_OR_DEPARTMENT_OTHER)
Admission: EM | Admit: 2024-05-31 | Discharge: 2024-05-31 | Disposition: A | Discharge status: Home / Self Care | Payer: Self-pay | Attending: Emergency Medicine | Admitting: Emergency Medicine

## 2024-05-31 DIAGNOSIS — R102 Pelvic and perineal pain: Secondary | ICD-10-CM

## 2024-05-31 DIAGNOSIS — N3000 Acute cystitis without hematuria: Secondary | ICD-10-CM

## 2024-05-31 LAB — URINALYSIS, ROUTINE W REFLEX MICROSCOPIC
Bilirubin Urine: NEGATIVE
Glucose, UA: NEGATIVE mg/dL
Nitrite: NEGATIVE
Protein, ur: 30 mg/dL — AB
Specific Gravity, Urine: 1.032 — ABNORMAL HIGH (ref 1.005–1.030)
pH: 6.5 (ref 5.0–8.0)

## 2024-05-31 MED ORDER — CEPHALEXIN 500 MG PO CAPS: 500.0000 mg | ORAL_CAPSULE | Freq: Three times a day (TID) | ORAL | 0 refills | Status: AC

## 2024-05-31 MED ORDER — HYDROCODONE-ACETAMINOPHEN 5-325 MG PO TABS: 2.0000 | ORAL_TABLET | ORAL | 0 refills | Status: AC | PRN

## 2024-05-31 NOTE — ED Provider Notes (Signed)
 [place] EMERGENCY DEPARTMENT AT [facility] Provider Note   CSN: [id number] Arrival date & time: 05/30/24  2130     Patient presents with: Abdominal Pain   Colleen Cunningham is a 23 y.o. female.   Patient is a 23 year old female with history of asthma.  Patient presenting today with complaints of lower abdominal pain.  She describes pain across her lower abdomen that has been present for the past week.  She does report her menses starting early this cycle.  She denies to me that she has currently sexually active and denies any vaginal discharge.  No fevers or chills.  No urinary complaints.  She does state that she does have some discomfort with bowel movements, but denies any constipation, diarrhea, or bloody stools.       Prior to Admission medications   Medication Sig Start Date End Date Taking? Authorizing Provider  albuterol  (VENTOLIN  HFA) 108 (90 Base) MCG/ACT inhaler Inhale 2 puffs into the lungs every 6 (six) hours as needed for wheezing or shortness of breath. 10/21/22   Lynwood Lenis, PA-C  cetirizine  (ZYRTEC ) 10 MG tablet TAKE 1 TABLET BY MOUTH NIGHTLY AS NEEDED FOR ALLERGY 04/10/23   Lorren Greig PARAS, NP  ibuprofen  (ADVIL ) 800 MG tablet Take 1 tablet (800 mg total) by mouth every 8 (eight) hours as needed for cramping. 09/08/22   Tanda Bleacher, MD    Allergies: Patient has no known allergies.    Review of Systems  All other systems reviewed and are negative.   Updated Vital Signs BP 122/78 (BP Location: Right Arm)   Pulse (!) 102   Temp 98.5 F (36.9 C) (Oral)   Resp 18   LMP 05/25/2024   SpO2 99%   Physical Exam Vitals and nursing note reviewed.  Constitutional:      General: She is not in acute distress.    Appearance: She is well-developed. She is not diaphoretic.  HENT:     Head: Normocephalic and atraumatic.  Cardiovascular:     Rate and Rhythm: Normal rate and regular rhythm.     Heart sounds: No murmur heard.    No friction rub. No gallop.   Pulmonary:     Effort: Pulmonary effort is normal. No respiratory distress.     Breath sounds: Normal breath sounds. No wheezing.  Abdominal:     General: Bowel sounds are normal. There is no distension.     Palpations: Abdomen is soft.     Tenderness: There is abdominal tenderness in the right lower quadrant, suprapubic area and left lower quadrant. There is no right CVA tenderness, left CVA tenderness, guarding or rebound.  Musculoskeletal:        General: Normal range of motion.     Cervical back: Normal range of motion and neck supple.  Skin:    General: Skin is warm and dry.  Neurological:     General: No focal deficit present.     Mental Status: She is alert and oriented to person, place, and time.     (all labs ordered are listed, but only abnormal results are displayed) Labs Reviewed  COMPREHENSIVE METABOLIC PANEL WITH GFR - Abnormal; Notable for the following components:      Result Value   CO2 21 (*)    Glucose, Bld 102 (*)    All other components within normal limits  CBC - Abnormal; Notable for the following components:   Hemoglobin 11.4 (*)    HCT 34.4 (*)    Platelets 457 (*)  All other components within normal limits  URINALYSIS, ROUTINE W REFLEX MICROSCOPIC - Abnormal; Notable for the following components:   Specific Gravity, Urine 1.032 (*)    Hgb urine dipstick LARGE (*)    Ketones, ur TRACE (*)    Protein, ur 30 (*)    Leukocytes,Ua MODERATE (*)    Bacteria, UA RARE (*)    Crystals PRESENT (*)    All other components within normal limits  LIPASE, BLOOD  HCG, QUANTITATIVE, PREGNANCY    EKG: None  Radiology: No results found.   Procedures   Medications Ordered in the ED - No data to display                                  Medical Decision Making Amount and/or Complexity of Data Reviewed Labs: ordered. Radiology: ordered.   Patient presenting here with lower abdominal pain as described in the HPI.  She arrives here with stable vital  signs and is afebrile.  There is tenderness across the lower abdomen, but no peritoneal signs.  Laboratory studies obtained including CBC, CMP, and lipase, all of which are unremarkable.  Pregnancy test is negative.  Urinalysis does show findings suggestive of UTI.  CT scan with renal protocol obtained showing a small amount of free fluid within the pelvis, the significance of which I am uncertain.  Perhaps she had a ruptured cyst or possibly this is physiologic.    Either way, nothing appears emergent and I feel patient can safely be discharged.  She will be prescribed Keflex  for UTI and given medication for pain.  I will set up an outpatient ultrasound to further evaluate her ovaries.     Final diagnoses:  None    ED Discharge Orders     None          Geroldine Berg, MD 05/31/24 [phone]

## 2024-05-31 NOTE — ED Notes (Signed)
 Patient transported to CT

## 2024-05-31 NOTE — Discharge Instructions (Addendum)
 Begin taking Keflex  as prescribed.  Take ibuprofen  600 mg every 6 hours as needed for pain.  Begin taking hydrocodone  as prescribed as needed for pain not relieved with ibuprofen .  Your US  revealed the following: IMPRESSION: 1. 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 This is indeterminate. Recommend further evaluation with MRI of the pelvis with and without contrast. 2. Trace free fluid in the pelvis. 3. No evidence of ovarian torsion.  Follow-up with an OB/GYN regarding these findings, you will likely need to undergo MRI imaging of the pelvis to further evaluate.

## 2024-05-31 NOTE — Telephone Encounter (Signed)
 Discussed results of the patient's US  with her and need for further diagnostic testing to include likely outpatient MRI of the Abdomen/Pelvis and follow-up with OBGYN.   Results: IMPRESSION: 1. 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 This is indeterminate. Recommend further evaluation with MRI of the pelvis with and without contrast. 2. Trace free fluid in the pelvis. 3. No evidence of ovarian torsion.  Offered for the patient to check back in to the ED for further pain management however the patient declined at this time and plans to follow-up outpatient. Reached out to Dr. Alger of on-call OBGYN to facilitate close outpatient follow-up.

## 2024-05-31 NOTE — ED Notes (Signed)
 Pt given discharge instructions. Opportunities given for questions. Pt stable at time of discharge. Instructed to return 05/31/24 at 3:30pm for US  appointment made prior to discharge at the bedside.

## 2024-06-01 ENCOUNTER — Other Ambulatory Visit: Payer: Self-pay

## 2024-06-01 ENCOUNTER — Emergency Department (HOSPITAL_COMMUNITY)
Admission: EM | Admit: 2024-06-01 | Discharge: 2024-06-02 | Disposition: A | Discharge status: Home / Self Care | Payer: Self-pay | Attending: Emergency Medicine | Admitting: Emergency Medicine

## 2024-06-01 DIAGNOSIS — R42 Dizziness and giddiness: Secondary | ICD-10-CM | POA: Insufficient documentation

## 2024-06-01 NOTE — ED Triage Notes (Signed)
 BIB EMS from home after calling for weakness/fatigue.   LMP 8/13 --> seen 8/18 at [facility] and diagnosed with UTI  8/19 --> US  --> mass on ovary with ?internal bleeding.   Has had consistent bleeding for a week with lower abd pain. Today around 1700 took a hydrocodone  and went to work. Felt dizzy, fatigue. Hypotensive 80/40 when standing with EMS but responsive to fluids.  3 pads today.  500 NS

## 2024-06-01 NOTE — ED Provider Notes (Signed)
 [place] EMERGENCY DEPARTMENT AT [facility] Provider Note   CSN: [id number] Arrival date & time: 06/01/24  2338     Patient presents with: Hypotension   Colleen Cunningham is a 23 y.o. female.  {Add pertinent medical, surgical, social history, OB history to YEP:67052} The history is provided by the patient and medical records.   23 year old female here after near syncopal event.  Patient reports she worked a full day today, did not eat all day.  States she was driving home around 5 PM and began to feel very lightheaded and dizzy.  States she pulled over and tried to get some water.  States trying to stand up she felt she was going to pass out so she sat back down and called EMS.  BP 80s over 40s when they arrived but improved with IV fluids.  She does admit to some ongoing abdominal pain.  Seen in the ED last night for same with normal CT but had follow-up ultrasound this afternoon which confirmed a mass on her right ovary that was indeterminate and recommended for MRI pelvis.  States she continues to have some vaginal bleeding which is irregular for her, not time for her menstrual cycle.  Using about 3 pads today.  She denies any history of ovarian issues or cyst in the past.  Mother is deceased but no issues from her side that she is aware of.  She does not currently have a PCP, OB/GYN, nor medical insurance currently.  Prior to Admission medications   Medication Sig Start Date End Date Taking? Authorizing Provider  albuterol  (VENTOLIN  HFA) 108 (90 Base) MCG/ACT inhaler Inhale 2 puffs into the lungs every 6 (six) hours as needed for wheezing or shortness of breath. 10/21/22   Lynwood Lenis, PA-C  cephALEXin  (KEFLEX ) 500 MG capsule Take 1 capsule (500 mg total) by mouth 3 (three) times daily. 05/31/24   Geroldine Berg, MD  cetirizine  (ZYRTEC ) 10 MG tablet TAKE 1 TABLET BY MOUTH NIGHTLY AS NEEDED FOR ALLERGY 04/10/23   Lorren Greig PARAS, NP  HYDROcodone -acetaminophen  (NORCO/VICODIN) 5-325 MG  tablet Take 2 tablets by mouth every 4 (four) hours as needed. 05/31/24   Geroldine Berg, MD  ibuprofen  (ADVIL ) 800 MG tablet Take 1 tablet (800 mg total) by mouth every 8 (eight) hours as needed for cramping. 09/08/22   Tanda Bleacher, MD    Allergies: Patient has no known allergies.    Review of Systems  Gastrointestinal:  Positive for abdominal pain.  Genitourinary:  Positive for vaginal bleeding.  All other systems reviewed and are negative.   Updated Vital Signs BP 115/71   Pulse 93   Temp 98.4 F (36.9 C)   Resp 14   Ht 5' 2 (1.575 m)   Wt 54.4 kg   LMP 05/25/2024   SpO2 100%   BMI 21.95 kg/m   Physical Exam Vitals and nursing note reviewed.  Constitutional:      Appearance: She is well-developed.  HENT:     Head: Normocephalic and atraumatic.  Eyes:     Conjunctiva/sclera: Conjunctivae normal.     Pupils: Pupils are equal, round, and reactive to light.  Cardiovascular:     Rate and Rhythm: Normal rate and regular rhythm.     Heart sounds: Normal heart sounds.  Pulmonary:     Effort: Pulmonary effort is normal.     Breath sounds: Normal breath sounds.  Abdominal:     General: Bowel sounds are normal.     Palpations: Abdomen is  soft.     Comments: Tender along right pelvis and right lower abdomen  Musculoskeletal:        General: Normal range of motion.     Cervical back: Normal range of motion.  Skin:    General: Skin is warm and dry.  Neurological:     Mental Status: She is alert and oriented to person, place, and time.     (all labs ordered are listed, but only abnormal results are displayed) Labs Reviewed  LIPASE, BLOOD  COMPREHENSIVE METABOLIC PANEL WITH GFR  CBC  URINALYSIS, ROUTINE W REFLEX MICROSCOPIC  HCG, SERUM, QUALITATIVE    EKG: None  Radiology: US  PELVIC COMPLETE WITH TRANSVAGINAL Result Date: 05/31/2024 CLINICAL DATA:  Pelvic pain EXAM: TRANSABDOMINAL AND TRANSVAGINAL ULTRASOUND OF PELVIS TECHNIQUE: Both transabdominal and  transvaginal ultrasound examinations of the pelvis were performed. Transabdominal technique was performed for global imaging of the pelvis including uterus, ovaries, adnexal regions, and pelvic cul-de-sac. It was necessary to proceed with endovaginal exam following the transabdominal exam to visualize the ovaries and endometrium. COMPARISON:  CT abdomen and pelvis 05/31/2024. Pelvic ultrasound 08/30/2019. FINDINGS: Uterus Measurements: 7.5 x 3.2 x 3.6 cm = volume: 45 mL. No fibroids or other mass visualized. Endometrium Thickness: 3.2 mm.  No focal abnormality visualized. Right ovary Measurements: 4.5 x 3.3 x 2.9 cm = volume: 22 mL. Heterogeneous hypoechoic vascular mass present in the right ovary measuring 1.7 x 1.7 x 1.6 cm. Left ovary Measurements: 4.0 x 1.9 x 4.1 cm = volume: 16 mL. Normal appearance/no adnexal mass. Other findings There is trace free fluid in the pelvis. IMPRESSION: 1. 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 This is indeterminate. Recommend further evaluation with MRI of the pelvis with and without contrast. 2. Trace free fluid in the pelvis. 3. No evidence of ovarian torsion. Electronically Signed   By: Greig Pique M.D.   On: 05/31/2024 16:51   CT Renal Stone Study Result Date: 05/31/2024 CLINICAL DATA:  Abdominal/flank pain, stone suspected. Sharp pains near umbilicus for 1 week. EXAM: CT ABDOMEN AND PELVIS WITHOUT CONTRAST TECHNIQUE: Multidetector CT imaging of the abdomen and pelvis was performed following the standard protocol without IV contrast. RADIATION DOSE REDUCTION: This exam was performed according to the departmental dose-optimization program which includes automated exposure control, adjustment of the mA and/or kV according to patient size and/or use of iterative reconstruction technique. COMPARISON:  None Available. FINDINGS: Lower chest: No acute abnormality. Hepatobiliary: Unremarkable liver. Normal gallbladder. No biliary  dilation. Pancreas: Unremarkable. Spleen: Unremarkable. Adrenals/Urinary Tract: Normal adrenal glands. No urinary calculi or hydronephrosis. Bladder is unremarkable. Stomach/Bowel: Normal caliber large and small bowel. No bowel wall thickening. Tentative visualization of a normal appendix (series 4, image [id number]). Is within normal limits. Vascular/Lymphatic: No significant vascular findings are present. No enlarged abdominal or pelvic lymph nodes. Reproductive: Evaluation of the pelvic structures is limited due to paucity of intra-abdominal fat and lack of oral or IV contrast. No definite acute abnormality in the uterus or ovaries. Other: Small amount of free fluid in the pelvis. No free intraperitoneal air. Musculoskeletal: No acute fracture. IMPRESSION: 1. Small volume free fluid in the pelvis may be normal but is slightly greater than expected for physiologic fluid. The uterus and ovaries are poorly evaluated without IV contrast. Consider pelvic ultrasound. Electronically Signed   By: Norman Gatlin M.D.   On: 05/31/2024 01:37    {Document cardiac monitor, telemetry assessment procedure when appropriate:32947} Procedures  Medications Ordered in the ED - No data to display    {Click here for ABCD2, HEART and other calculators REFRESH Note before signing:1}                              Medical Decision Making Amount and/or Complexity of Data Reviewed Labs: ordered.   ***  {Document critical care time when appropriate  Document review of labs and clinical decision tools ie CHADS2VASC2, etc  Document your independent review of radiology images and any outside records  Document your discussion with family members, caretakers and with consultants  Document social determinants of health affecting pt's care  Document your decision making why or why not admission, treatments were needed:32947:::1}   Final diagnoses:  None    ED Discharge Orders     None

## 2024-06-02 ENCOUNTER — Emergency Department (HOSPITAL_COMMUNITY): Payer: Self-pay

## 2024-06-02 LAB — CBC
HCT: 28 % — ABNORMAL LOW (ref 36.0–46.0)
Hemoglobin: 8.9 g/dL — ABNORMAL LOW (ref 12.0–15.0)
MCH: 29.3 pg (ref 26.0–34.0)
MCHC: 31.8 g/dL (ref 30.0–36.0)
MCV: 92.1 fL (ref 80.0–100.0)
Platelets: 386 K/uL (ref 150–400)
RBC: 3.04 MIL/uL — ABNORMAL LOW (ref 3.87–5.11)
RDW: 15.1 % (ref 11.5–15.5)
WBC: 11.8 K/uL — ABNORMAL HIGH (ref 4.0–10.5)
nRBC: 0 % (ref 0.0–0.2)

## 2024-06-02 LAB — COMPREHENSIVE METABOLIC PANEL WITH GFR
ALT: 8 U/L (ref 0–44)
AST: 16 U/L (ref 15–41)
Albumin: 2.9 g/dL — ABNORMAL LOW (ref 3.5–5.0)
Alkaline Phosphatase: 44 U/L (ref 38–126)
Anion gap: 11 (ref 5–15)
BUN: 7 mg/dL (ref 6–20)
CO2: 21 mmol/L — ABNORMAL LOW (ref 22–32)
Calcium: 8 mg/dL — ABNORMAL LOW (ref 8.9–10.3)
Chloride: 108 mmol/L (ref 98–111)
Creatinine, Ser: 0.77 mg/dL (ref 0.44–1.00)
GFR, Estimated: >60 mL/min (ref >60)
Glucose, Bld: 97 mg/dL (ref 70–99)
Potassium: 3.3 mmol/L — ABNORMAL LOW (ref 3.5–5.1)
Sodium: 140 mmol/L (ref 135–145)
Total Bilirubin: 0.5 mg/dL (ref 0.0–1.2)
Total Protein: 6.2 g/dL — ABNORMAL LOW (ref 6.5–8.1)

## 2024-06-02 LAB — HCG, SERUM, QUALITATIVE: Preg, Serum: NEGATIVE

## 2024-06-02 LAB — LIPASE, BLOOD: Lipase: 20 U/L (ref 11–51)

## 2024-06-02 MED ORDER — GADOBUTROL 1 MMOL/ML IV SOLN
5.0000 mL | Freq: Once | INTRAVENOUS | Status: AC | PRN
  Administered 2024-06-02: 5 mL via INTRAVENOUS

## 2024-06-02 NOTE — Discharge Instructions (Signed)
 Your MRI today did not show any suspicious mass in your right ovary.  Unclear what was seen on your ultrasound prior. Your hemoglobin has dropped a little today but remains overall stable. Recommend close follow-up with OB/GYN--I would call in the morning to get this scheduled.  Notify them this is for ED visit follow-up. Can continue your hydrocodone  as needed for pain. Return here for new concerns.

## 2024-06-02 NOTE — ED Notes (Signed)
Patient returned back from MRI.

## 2024-06-02 NOTE — ED Notes (Signed)
 Patient transported to MRI

## 2024-06-04 ENCOUNTER — Emergency Department (HOSPITAL_COMMUNITY)
Admission: EM | Admit: 2024-06-04 | Discharge: 2024-06-04 | Disposition: A | Discharge status: Home / Self Care | Payer: Self-pay | Attending: Emergency Medicine | Admitting: Emergency Medicine

## 2024-06-04 ENCOUNTER — Other Ambulatory Visit: Payer: Self-pay

## 2024-06-04 ENCOUNTER — Encounter (HOSPITAL_COMMUNITY): Payer: Self-pay | Admitting: *Deleted

## 2024-06-04 DIAGNOSIS — N760 Acute vaginitis: Secondary | ICD-10-CM | POA: Insufficient documentation

## 2024-06-04 DIAGNOSIS — B9689 Other specified bacterial agents as the cause of diseases classified elsewhere: Secondary | ICD-10-CM | POA: Insufficient documentation

## 2024-06-04 DIAGNOSIS — R109 Unspecified abdominal pain: Secondary | ICD-10-CM

## 2024-06-04 LAB — COMPREHENSIVE METABOLIC PANEL WITH GFR
ALT: 9 U/L (ref 0–44)
AST: 19 U/L (ref 15–41)
Albumin: 3.6 g/dL (ref 3.5–5.0)
Alkaline Phosphatase: 54 U/L (ref 38–126)
Anion gap: 14 (ref 5–15)
BUN: 5 mg/dL — ABNORMAL LOW (ref 6–20)
CO2: 21 mmol/L — ABNORMAL LOW (ref 22–32)
Calcium: 9.7 mg/dL (ref 8.9–10.3)
Chloride: 106 mmol/L (ref 98–111)
Creatinine, Ser: 0.77 mg/dL (ref 0.44–1.00)
GFR, Estimated: >60 mL/min (ref >60)
Glucose, Bld: 95 mg/dL (ref 70–99)
Potassium: 4 mmol/L (ref 3.5–5.1)
Sodium: 141 mmol/L (ref 135–145)
Total Bilirubin: 0.4 mg/dL (ref 0.0–1.2)
Total Protein: 8.1 g/dL (ref 6.5–8.1)

## 2024-06-04 LAB — URINALYSIS, ROUTINE W REFLEX MICROSCOPIC
Bacteria, UA: NONE SEEN
Bilirubin Urine: NEGATIVE
Glucose, UA: NEGATIVE mg/dL
Hgb urine dipstick: NEGATIVE
Ketones, ur: 80 mg/dL — AB
Nitrite: NEGATIVE
Protein, ur: 100 mg/dL — AB
Specific Gravity, Urine: 1.027 (ref 1.005–1.030)
pH: 6 (ref 5.0–8.0)

## 2024-06-04 LAB — CBC
HCT: 36.4 % (ref 36.0–46.0)
Hemoglobin: 11.8 g/dL — ABNORMAL LOW (ref 12.0–15.0)
MCH: 29.1 pg (ref 26.0–34.0)
MCHC: 32.4 g/dL (ref 30.0–36.0)
MCV: 89.9 fL (ref 80.0–100.0)
Platelets: 611 K/uL — ABNORMAL HIGH (ref 150–400)
RBC: 4.05 MIL/uL (ref 3.87–5.11)
RDW: 14.8 % (ref 11.5–15.5)
WBC: 10.3 K/uL (ref 4.0–10.5)
nRBC: 0 % (ref 0.0–0.2)

## 2024-06-04 LAB — LIPASE, BLOOD: Lipase: 23 U/L (ref 11–51)

## 2024-06-04 LAB — WET PREP, GENITAL
Sperm: NONE SEEN
Trich, Wet Prep: NONE SEEN
WBC, Wet Prep HPF POC: >=10 — AB (ref <10)
Yeast Wet Prep HPF POC: NONE SEEN

## 2024-06-04 LAB — HCG, SERUM, QUALITATIVE: Preg, Serum: NEGATIVE

## 2024-06-04 MED ORDER — KETOROLAC TROMETHAMINE 30 MG/ML IJ SOLN
30.0000 mg | Freq: Once | INTRAMUSCULAR | Status: AC
  Administered 2024-06-04: 30 mg via INTRAMUSCULAR
  Filled 2024-06-04: qty 1

## 2024-06-04 MED ORDER — MORPHINE SULFATE (PF) 4 MG/ML IV SOLN
4.0000 mg | Freq: Once | INTRAVENOUS | Status: AC
  Administered 2024-06-04: 4 mg via INTRAMUSCULAR
  Filled 2024-06-04: qty 1

## 2024-06-04 MED ORDER — IBUPROFEN 600 MG PO TABS: 600.0000 mg | ORAL_TABLET | Freq: Three times a day (TID) | ORAL | 0 refills | Status: AC | PRN

## 2024-06-04 MED ORDER — METRONIDAZOLE 500 MG PO TABS
500.0000 mg | ORAL_TABLET | Freq: Two times a day (BID) | ORAL | 0 refills | Status: AC
Start: 2024-06-05 — End: 2024-06-12

## 2024-06-04 MED ORDER — FLUCONAZOLE 200 MG PO TABS: 200.0000 mg | ORAL_TABLET | Freq: Every day | ORAL | 0 refills | Status: AC | PRN

## 2024-06-04 MED ORDER — METRONIDAZOLE 500 MG PO TABS
500.0000 mg | ORAL_TABLET | Freq: Once | ORAL | Status: AC
  Administered 2024-06-04: 500 mg via ORAL
  Filled 2024-06-04: qty 1

## 2024-06-04 NOTE — ED Provider Notes (Signed)
 [place] EMERGENCY DEPARTMENT AT [facility] Provider Note   CSN: [id number] Arrival date & time: 06/04/24  1223     Patient presents with: Abdominal Pain   Colleen Cunningham is a 23 y.o. female\ here with  here with lower abdominal pain.  This has been ongoing for 2 weeks.  She was seen in the ED on 8/19 and had a CT abdomen and pelvis ultrasound with questionable ovarian cyst.  She returned 8/21 and had an MRI pelvis  with no abnormal findings; questionable reactive groin lymph nodes.  She continues to report persistent pelvic/suprapubic pain, worse with movement. Reports she ran out of norco.  She does not have a consistent OBGYN but made a new appointment.  She denies vaginal discharge but requests STI testing.  Patient reports she did not fill the keflex  antibiotic prescription for 8/19 because she was concerned it would upset her stomach.  She did not feel that this was a UTI   HPI     Prior to Admission medications   Medication Sig Start Date End Date Taking? Authorizing Provider  fluconazole  (DIFLUCAN ) 200 MG tablet Take 1 tablet (200 mg total) by mouth daily as needed for up to 2 doses. 06/04/24  Yes Nellene Courtois, Donnice PARAS, MD  ibuprofen  (ADVIL ) 600 MG tablet Take 1 tablet (600 mg total) by mouth every 8 (eight) hours as needed for up to 30 doses for mild pain (pain score 1-3) or moderate pain (pain score 4-6). 06/04/24  Yes Royalty Domagala, Donnice PARAS, MD  metroNIDAZOLE  (FLAGYL ) 500 MG tablet Take 1 tablet (500 mg total) by mouth 2 (two) times daily for 7 days. 06/05/24 06/12/24 Yes Kailyn Vanderslice, Donnice PARAS, MD  albuterol  (VENTOLIN  HFA) 108 (90 Base) MCG/ACT inhaler Inhale 2 puffs into the lungs every 6 (six) hours as needed for wheezing or shortness of breath. 10/21/22   Lynwood Lenis, PA-C  cephALEXin  (KEFLEX ) 500 MG capsule Take 1 capsule (500 mg total) by mouth 3 (three) times daily. 05/31/24   Geroldine Berg, MD  cetirizine  (ZYRTEC ) 10 MG tablet TAKE 1 TABLET BY MOUTH NIGHTLY AS NEEDED FOR ALLERGY  04/10/23   Lorren Greig PARAS, NP  HYDROcodone -acetaminophen  (NORCO/VICODIN) 5-325 MG tablet Take 2 tablets by mouth every 4 (four) hours as needed. 05/31/24   Geroldine Berg, MD  ibuprofen  (ADVIL ) 800 MG tablet Take 1 tablet (800 mg total) by mouth every 8 (eight) hours as needed for cramping. 09/08/22   Tanda Bleacher, MD    Allergies: Patient has no known allergies.    Review of Systems  Updated Vital Signs BP 123/71 (BP Location: Right Arm)   Pulse 65   Temp 98 F (36.7 C) (Oral)   Resp 16   Ht 5' 2 (1.575 m)   Wt 54.4 kg   LMP 05/25/2024   SpO2 100%   BMI 21.95 kg/m   Physical Exam Constitutional:      General: She is not in acute distress.    Comments: Standing, uncomfortable, hobbled over  HENT:     Head: Normocephalic and atraumatic.  Eyes:     Conjunctiva/sclera: Conjunctivae normal.     Pupils: Pupils are equal, round, and reactive to light.  Cardiovascular:     Rate and Rhythm: Normal rate and regular rhythm.  Pulmonary:     Effort: Pulmonary effort is normal. No respiratory distress.  Abdominal:     General: There is no distension.     Tenderness: There is abdominal tenderness in the suprapubic area.  Genitourinary:  Comments: Exam performed with chaperone present Reda Dolly External: Normal external female genitalia. No lesions, rashes, drainage, or suspicious lymph nodes. Internal: No CMT. Cervix closed and without erythema. Minimal physiologic lochia. No adnexal ttp.  Greenish fluid in vaginal vault, malodorous   Skin:    General: Skin is warm and dry.  Neurological:     General: No focal deficit present.     Mental Status: She is alert. Mental status is at baseline.  Psychiatric:        Mood and Affect: Mood normal.        Behavior: Behavior normal.     (all labs ordered are listed, but only abnormal results are displayed) Labs Reviewed  WET PREP, GENITAL - Abnormal; Notable for the following components:      Result Value   Clue Cells Wet  Prep HPF POC PRESENT (*)    WBC, Wet Prep HPF POC >=10 (*)    All other components within normal limits  COMPREHENSIVE METABOLIC PANEL WITH GFR - Abnormal; Notable for the following components:   CO2 21 (*)    BUN 5 (*)    All other components within normal limits  CBC - Abnormal; Notable for the following components:   Hemoglobin 11.8 (*)    Platelets 611 (*)    All other components within normal limits  URINALYSIS, ROUTINE W REFLEX MICROSCOPIC - Abnormal; Notable for the following components:   APPearance HAZY (*)    Ketones, ur 80 (*)    Protein, ur 100 (*)    Leukocytes,Ua SMALL (*)    All other components within normal limits  URINE CULTURE  LIPASE, BLOOD  HCG, SERUM, QUALITATIVE  GC/CHLAMYDIA PROBE AMP ([place]) NOT AT [facility]    EKG: None  Radiology: No results found.   Procedures   Medications Ordered in the ED  metroNIDAZOLE  (FLAGYL ) tablet 500 mg (has no administration in time range)  morphine  (PF) 4 MG/ML injection 4 mg (4 mg Intramuscular Given 06/04/24 1505)  ketorolac  (TORADOL ) 30 MG/ML injection 30 mg (30 mg Intramuscular Given 06/04/24 1504)    Clinical Course as of 06/04/24 1834  Sat Jun 04, 2024  1831 Patient reports no new sexual partners in the past few months and she was last tested negative for chlamydia gonorrhea.  Therefore she is low risk for chlamydia gonorrhea and does not need additional empiric antibiotics.  We will treat for BV.  She will follow-up and urine culture results.  If these test positive for UTI, she likely needs a separate course of antibiotics for bladder infection.  But there are not clear markers here at this time, only small leukocytes which may be contaminant.  She will also follow-up with OB and I advised that she take regular ibuprofen  at home for possible dysmenorrhea.  She verbalized understanding [MT]    Clinical Course User Index [MT] Kemoni Quesenberry, Donnice PARAS, MD                                 Medical Decision Making Amount  and/or Complexity of Data Reviewed Labs: ordered.  Risk Prescription drug management.   This patient presents to the ED with concern for pelvic lower abdominal pain. This involves an extensive number of treatment options, and is a complaint that carries with it a high risk of complications and morbidity.  The differential diagnosis includes PID vs ovarian cyst vs dysmenorrhea vs other  External records from outside  source obtained and reviewed including MRI report  I ordered and personally interpreted labs.  The pertinent results include:  no emergent findings on bloodwork. CBC wnl. UA with only small leukocytes, negative nitrites.  Wet prep with clue cells consistent with BV.  The patient was maintained on a cardiac monitor.  I personally viewed and interpreted the cardiac monitored which showed an underlying rhythm of: NSR  I ordered medication including IM pain medications  I have reviewed the patients home medicines and have made adjustments as needed  Test Considered: no indication for repeat pelvic ultrasound or CT imaging of the abdomen.  I have a low suspicion for ovarian torsion, appendicitis, or other infectious or surgical emergency with this presentation.  She has had a robust workup in the past week.  After the interventions noted above, I reevaluated the patient and found that they have: improved  Disposition:  After consideration of the diagnostic results and the patients response to treatment, I feel that the patent would benefit from close outpatient follow-up      Final diagnoses:  Abdominal pain, unspecified abdominal location  BV (bacterial vaginosis)    ED Discharge Orders          Ordered    metroNIDAZOLE  (FLAGYL ) 500 MG tablet  2 times daily        06/04/24 1833    fluconazole  (DIFLUCAN ) 200 MG tablet  Daily PRN        06/04/24 1833    ibuprofen  (ADVIL ) 600 MG tablet  Every 8 hours PRN        06/04/24 1833               Cottie Donnice PARAS,  MD 06/04/24 [phone]

## 2024-06-04 NOTE — ED Notes (Addendum)
 Abd pain began Sunday 8/10 and was describes as a dull ache. Menstrual period began 8/13 and pt attributed s/s to same. Pain has continued to increase in severity in the days since and occasionally radiates to chest since yesterday. Pt denies N/V/D.

## 2024-06-04 NOTE — ED Notes (Signed)
 Pt requests STD testing. She's not aware of a recent exposure but is adamant about finding a cause for the constant abd pain.

## 2024-06-04 NOTE — ED Triage Notes (Signed)
 C/o lower abd. Pain states she has been seen in the ED 3 times since 08/19 had u/s and CT however she is still hurting, denies n/v

## 2024-06-04 NOTE — ED Notes (Signed)
 Pt diagnosed with UTI on 05/31/24. To date pt has not taken rx as pt suspects that cause of abd pain is more severe than UTI diagnosis.

## 2024-06-04 NOTE — Discharge Instructions (Addendum)
 Take medication as prescribed.  Be aware that your urine culture will take a few days to result.  If you do have a urinary tract infection, you will receive a phone call likely prescribing you additional antibiotics to treat this.  In the meantime, you should follow-up with OB/GYN as scheduled.  You may be having abnormal uterine bleeding or menstrual issues causing pain.  I do recommend you take ibuprofen  600 mg every 8 hours, with meals, regularly for the next 5 days.  This could also help with persistent pelvic or abdominal pain.

## 2024-06-06 ENCOUNTER — Ambulatory Visit (HOSPITAL_BASED_OUTPATIENT_CLINIC_OR_DEPARTMENT_OTHER): Payer: Self-pay | Admitting: Certified Nurse Midwife

## 2024-06-06 ENCOUNTER — Other Ambulatory Visit (HOSPITAL_COMMUNITY)
Admission: RE | Admit: 2024-06-06 | Discharge: 2024-06-06 | Disposition: A | Discharge status: Home / Self Care | Payer: Self-pay | Source: Ambulatory Visit | Attending: Certified Nurse Midwife | Admitting: Certified Nurse Midwife

## 2024-06-06 ENCOUNTER — Encounter (HOSPITAL_BASED_OUTPATIENT_CLINIC_OR_DEPARTMENT_OTHER): Payer: Self-pay | Admitting: Certified Nurse Midwife

## 2024-06-06 VITALS — BP 122/87 | HR 82 | Ht 62.0 in | Wt 115.0 lb

## 2024-06-06 DIAGNOSIS — Z124 Encounter for screening for malignant neoplasm of cervix: Secondary | ICD-10-CM | POA: Insufficient documentation

## 2024-06-06 LAB — GC/CHLAMYDIA PROBE AMP (~~LOC~~) NOT AT ARMC
Chlamydia: POSITIVE — AB
Comment: NEGATIVE
Comment: NORMAL
Neisseria Gonorrhea: NEGATIVE

## 2024-06-06 NOTE — Progress Notes (Signed)
 GYNECOLOGY  VISIT  CC:   Follow-up from ED  HPI: 23 y.o. G0P0000 Single Black or [place] American female here for follow-up. Pt states she presented to ED for pain. Pain has resolved and she is not in pain today. Pregnancy test was negative. STD Screening Negative. No prior pap and she will be 33 in one week (pt okay with pap smear today).   Past Medical History:  Diagnosis Date   Allergy    Asthma     MEDS:   Current Outpatient Medications on File Prior to Visit  Medication Sig Dispense Refill   albuterol  (VENTOLIN  HFA) 108 (90 Base) MCG/ACT inhaler Inhale 2 puffs into the lungs every 6 (six) hours as needed for wheezing or shortness of breath. 8 g 2   cetirizine  (ZYRTEC ) 10 MG tablet TAKE 1 TABLET BY MOUTH NIGHTLY AS NEEDED FOR ALLERGY 90 tablet 0   HYDROcodone -acetaminophen  (NORCO/VICODIN) 5-325 MG tablet Take 2 tablets by mouth every 4 (four) hours as needed. 10 tablet 0   ibuprofen  (ADVIL ) 600 MG tablet Take 1 tablet (600 mg total) by mouth every 8 (eight) hours as needed for up to 30 doses for mild pain (pain score 1-3) or moderate pain (pain score 4-6). 30 tablet 0   ibuprofen  (ADVIL ) 800 MG tablet Take 1 tablet (800 mg total) by mouth every 8 (eight) hours as needed for cramping. 60 tablet 3   cephALEXin  (KEFLEX ) 500 MG capsule Take 1 capsule (500 mg total) by mouth 3 (three) times daily. (Patient not taking: Reported on 06/06/2024) 21 capsule 0   fluconazole  (DIFLUCAN ) 200 MG tablet Take 1 tablet (200 mg total) by mouth daily as needed for up to 2 doses. (Patient not taking: Reported on 06/06/2024) 2 tablet 0   metroNIDAZOLE  (FLAGYL ) 500 MG tablet Take 1 tablet (500 mg total) by mouth 2 (two) times daily for 7 days. (Patient not taking: Reported on 06/06/2024) 14 tablet 0   No current facility-administered medications on file prior to visit.    ALLERGIES: Patient has no known allergies.  Review of Systems  Constitutional:  Negative for chills, diaphoresis and fever.   Genitourinary: Negative.   Musculoskeletal: Negative.     PHYSICAL EXAMINATION:    BP 122/87   Pulse 82   Ht 5' 2 (1.575 m) Comment: Reported  Wt 115 lb (52.2 kg)   LMP 05/25/2024   BMI 21.03 kg/m     General appearance: alert, cooperative and appears stated age Abdomen: soft, non-tender; bowel sounds normal; no masses,  no organomegaly Lymph:  no inguinal LAD noted  Pelvic: External genitalia:  no lesions              Urethra:  normal appearing urethra with no masses, tenderness or lesions              Bartholins and Skenes: normal                 Vagina: normal mucosa without prolapse or lesions and normal without tenderness, induration or masses              Cervix: no lesions and nulliparous appearance              Bimanual Exam:  Uterus:  normal size, contour, position, consistency, mobility, non-tender              Adnexa: no mass, fullness, tenderness             Anus:  normal sphincter tone, no lesions  Chaperone,  CMA, was present for exam.  Assessment/Plan: 1. Cervical cancer screening (Primary) - Pt here for follow-up from ED (prior pain has resolved). - Cytology - PAP( [facility])  RTO in 1 year for annual gyn exam and prn if issues arise or if pain recurs.  Arland MARLA Roller

## 2024-06-07 ENCOUNTER — Telehealth (HOSPITAL_BASED_OUTPATIENT_CLINIC_OR_DEPARTMENT_OTHER): Payer: Self-pay | Admitting: Certified Nurse Midwife

## 2024-06-07 MED ORDER — DOXYCYCLINE HYCLATE 100 MG PO TABS: 100.0000 mg | ORAL_TABLET | Freq: Two times a day (BID) | ORAL | 0 refills | Status: AC

## 2024-06-07 NOTE — Telephone Encounter (Signed)
 New Message  The patient is requesting a call back to discuss her test results and to find out if any medications need to be called in.   her preferred pharmacy is [facility] on [address].

## 2024-06-07 NOTE — Telephone Encounter (Signed)
 Spoke with patient. Advised chlamydia is positive from 06/04/24. Aware she will need to abstain from intercourse until she and her partner have received treatment and for 1 week following. Will need to use condoms for protection against STD's with intercourse. Patient verbalizes understanding. Rx for Doxycycline  100 mg po BID x 7 days #14 0RF sent to verified pharmacy [facility] [address] [facility] department confidential communicable disease report completed and faxed with results to the [facility] Department at [phone].

## 2024-06-08 ENCOUNTER — Ambulatory Visit (HOSPITAL_BASED_OUTPATIENT_CLINIC_OR_DEPARTMENT_OTHER): Payer: Self-pay | Admitting: Certified Nurse Midwife

## 2024-06-08 LAB — CYTOLOGY - PAP: Diagnosis: NEGATIVE

## 2024-08-22 ENCOUNTER — Ambulatory Visit (HOSPITAL_BASED_OUTPATIENT_CLINIC_OR_DEPARTMENT_OTHER): Payer: Self-pay
